# Patient Record
Sex: Male | Born: 1995 | Race: White | Hispanic: No | Marital: Single | Smoking: Former smoker
Health system: Southern US, Community
[De-identification: ages and names within clinical notes are randomized; demographics above are authoritative.]

## PROBLEM LIST (undated history)

## (undated) DIAGNOSIS — E039 Hypothyroidism, unspecified: Secondary | ICD-10-CM

## (undated) DIAGNOSIS — R011 Cardiac murmur, unspecified: Secondary | ICD-10-CM

## (undated) DIAGNOSIS — I10 Essential (primary) hypertension: Secondary | ICD-10-CM

## (undated) DIAGNOSIS — J189 Pneumonia, unspecified organism: Secondary | ICD-10-CM

## (undated) DIAGNOSIS — Z973 Presence of spectacles and contact lenses: Secondary | ICD-10-CM

## (undated) HISTORY — PX: FOOT SURGERY: SHX648

---
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
## 2020-02-10 ENCOUNTER — Encounter: Payer: Self-pay | Admitting: Family Medicine

## 2020-02-10 ENCOUNTER — Ambulatory Visit
Admission: EM | Admit: 2020-02-10 | Discharge: 2020-02-10 | Disposition: A | Discharge status: Home / Self Care | Payer: BC Managed Care – PPO | Attending: Family Medicine | Admitting: Family Medicine

## 2020-02-10 DIAGNOSIS — F411 Generalized anxiety disorder: Secondary | ICD-10-CM | POA: Diagnosis not present

## 2020-02-10 MED ORDER — HYDROXYZINE HCL 25 MG PO TABS: 25.0000 mg | ORAL_TABLET | Freq: Four times a day (QID) | ORAL | 0 refills | Status: DC

## 2020-02-10 NOTE — Discharge Instructions (Signed)
Use the hydroxyzine as needed for anxiety and to help rest I would follow up with your doctor to possible start on an SSRI Also would recommend see a therapist or counselor to talk Try to decrease stress Exercise, meditate

## 2020-02-10 NOTE — ED Triage Notes (Addendum)
Patients states that he is under a lot of stress at work and is having increased anxiety. States he has took on a new job recently and his stress has increased.

## 2020-02-11 NOTE — ED Provider Notes (Signed)
[facility]    CSN: [id number] Arrival date & time: 02/10/20  1126      History   Chief Complaint No chief complaint on file.   HPI Alex Rivera is a 24 y.o. male.   Patient is a 24 year old male presents today for stress and anxiety.  Reporting he has had increased anxiety over the past few months.  Reporting started since starting a new position at his work.  He has frequent anxiety attacks and anger outburst at work.  Is not currently taking any medication for anxiety.  Has had anxiety from time to time throughout his life.  Denies any severe depression or suicidal ideations.     History reviewed. No pertinent past medical history.  There are no problems to display for this patient.   History reviewed. No pertinent surgical history.     Home Medications    Prior to Admission medications   Medication Sig Start Date End Date Taking? Authorizing Provider  hydrOXYzine (ATARAX/VISTARIL) 25 MG tablet Take 1 tablet (25 mg total) by mouth every 6 (six) hours. 02/10/20   Janace Aris, NP    Family History Family History  Problem Relation Age of Onset  . Hypertension Mother   . Cancer Father     Social History Social History   Tobacco Use  . Smoking status: Current Some Day Smoker  . Smokeless tobacco: Never Used  Substance Use Topics  . Alcohol use: Not on file    Comment: occ  . Drug use: Never     Allergies   Patient has no known allergies.   Review of Systems Review of Systems   Physical Exam Triage Vital Signs ED Triage Vitals  Enc Vitals Group     BP 02/10/20 1138 (!) 147/103     Pulse Rate 02/10/20 1138 90     Resp 02/10/20 1138 16     Temp 02/10/20 1138 99 F (37.2 C)     Temp Source 02/10/20 1138 Oral     SpO2 02/10/20 1138 98 %     Weight 02/10/20 1145 (!) 315 lb (142.9 kg)     Height 02/10/20 1145 6' (1.829 m)     Head Circumference --      Peak Flow --      Pain Score 02/10/20 1142 0     Pain Loc --      Pain  Edu? --      Excl. in GC? --    No data found.  Updated Vital Signs BP (!) 147/103 (BP Location: Right Arm)   Pulse 90   Temp 99 F (37.2 C) (Oral)   Resp 16   Ht 6' (1.829 m)   Wt (!) 315 lb (142.9 kg)   SpO2 98%   BMI 42.72 kg/m   Visual Acuity Right Eye Distance:   Left Eye Distance:   Bilateral Distance:    Right Eye Near:   Left Eye Near:    Bilateral Near:     Physical Exam Vitals and nursing note reviewed.  Constitutional:      Appearance: Normal appearance.  HENT:     Head: Normocephalic and atraumatic.     Nose: Nose normal.  Eyes:     Conjunctiva/sclera: Conjunctivae normal.  Pulmonary:     Effort: Pulmonary effort is normal.  Musculoskeletal:        General: Normal range of motion.     Cervical back: Normal range of motion.  Skin:    General:  Skin is warm and dry.  Neurological:     Mental Status: He is alert.  Psychiatric:        Mood and Affect: Mood normal.      UC Treatments / Results  Labs (all labs ordered are listed, but only abnormal results are displayed) Labs Reviewed - No data to display  EKG   Radiology No results found.  Procedures Procedures (including critical care time)  Medications Ordered in UC Medications - No data to display  Initial Impression / Assessment and Plan / UC Course  I have reviewed the triage vital signs and the nursing notes.  Pertinent labs & imaging results that were available during my care of the patient were reviewed by me and considered in my medical decision making (see chart for details).     Anxiety No concerns for severe depression or suicidal ideations. Most likely anxiety related to stress of his job. Will prescribe hydroxyzine to use as needed and to help rest and sleep at night. Recommended follow-up with his doctor for further management and to be evaluated for possible initiation of SSRI. Recommended to see a therapist or counselor to talk Decrease stress, exercise and  meditate. Final Clinical Impressions(s) / UC Diagnoses   Final diagnoses:  Anxiety state     Discharge Instructions     Use the hydroxyzine as needed for anxiety and to help rest I would follow up with your doctor to possible start on an SSRI Also would recommend see a therapist or counselor to talk Try to decrease stress Exercise, meditate    ED Prescriptions    Medication Sig Dispense Auth. Provider   hydrOXYzine (ATARAX/VISTARIL) 25 MG tablet Take 1 tablet (25 mg total) by mouth every 6 (six) hours. 20 tablet Dahlia Byes A, NP     PDMP not reviewed this encounter.   Dahlia Byes A, NP 02/11/20 1455

## 2020-05-15 ENCOUNTER — Encounter (HOSPITAL_COMMUNITY): Payer: Self-pay | Admitting: Emergency Medicine

## 2020-05-15 ENCOUNTER — Emergency Department (HOSPITAL_COMMUNITY)
Admission: EM | Admit: 2020-05-15 | Discharge: 2020-05-15 | Disposition: A | Discharge status: Home / Self Care | Payer: BC Managed Care – PPO | Attending: Emergency Medicine | Admitting: Emergency Medicine

## 2020-05-15 ENCOUNTER — Other Ambulatory Visit: Payer: Self-pay

## 2020-05-15 ENCOUNTER — Emergency Department (HOSPITAL_COMMUNITY): Payer: BC Managed Care – PPO

## 2020-05-15 DIAGNOSIS — R1031 Right lower quadrant pain: Secondary | ICD-10-CM | POA: Diagnosis present

## 2020-05-15 DIAGNOSIS — F17210 Nicotine dependence, cigarettes, uncomplicated: Secondary | ICD-10-CM | POA: Insufficient documentation

## 2020-05-15 DIAGNOSIS — K4091 Unilateral inguinal hernia, without obstruction or gangrene, recurrent: Secondary | ICD-10-CM

## 2020-05-15 NOTE — ED Notes (Signed)
Patient transported to Ultrasound 

## 2020-05-15 NOTE — Discharge Instructions (Signed)
Please read the attachment on inguinal hernias.  I recommend that you take ibuprofen 600 mg every 6 hours as needed for pain symptoms.  Please wear compression shorts until you are evaluated by general surgery.  You will need to be seen by [facility] next week.  I spoke with Dr. Bedelia Person who asked that you call first thing Monday AM to schedule appointment.    Please try to avoid any strenuous activity or standing on your feet for extended periods of time.  I will provide you with a work note.  You may return to work, if only for light duty.  Please return to the ED or seek immediate medical attention should you experience any new or worsening symptoms.

## 2020-05-15 NOTE — ED Notes (Signed)
Patient verbalized understanding of dc instructions, vss, ambulatory with nad.   

## 2020-05-15 NOTE — ED Provider Notes (Signed)
[place] [facility] DEPART[facility] Provider Note   CSN: [id number] Arrival date & time: 05/15/20  4081     History Chief Complaint  Patient presents with  . Groin Pain  . Groin Swelling    Alex Rivera is a 24 y.o. male who presents the ED with complaints of progressively worsening swelling involving right scrotum.  Patient has a history of right-sided inguinal pain x1 year.  However, over the course of the past 4 to 5 days, he has been experiencing progressively worsening swelling involving the scrotum.  He states that his right testicle is approximately size of his fist.  He reports that he saw a pediatrician growing up and believes that he received all of his immunizations, including MMR.  He denies any fevers or chills.  He is sexually active, but uses protection.  Denies any penile discharge or rashes.  He also denies any abdominal pain aside from his subacute/chronic right-sided inguinal pain.  He works a physically demanding job at [employer] which requires him to lift heavy objects.  HPI     History reviewed. No pertinent past medical history.  There are no problems to display for this patient.   History reviewed. No pertinent surgical history.     Family History  Problem Relation Age of Onset  . Hypertension Mother   . Cancer Father     Social History   Tobacco Use  . Smoking status: Current Some Day Smoker  . Smokeless tobacco: Never Used  Substance Use Topics  . Alcohol use: Not on file    Comment: occ  . Drug use: Never    Home Medications Prior to Admission medications   Medication Sig Start Date End Date Taking? Authorizing Provider  hydrOXYzine (ATARAX/VISTARIL) 25 MG tablet Take 1 tablet (25 mg total) by mouth every 6 (six) hours. 02/10/20   Orvan July, NP    Allergies    Patient has no known allergies.  Review of Systems   Review of Systems  All other systems reviewed and are negative.   Physical Exam Updated  Vital Signs BP (!) 159/102 (BP Location: Left Arm)   Pulse 100   Temp 98 F (36.7 C) (Oral)   Resp 17   Ht 5' 11"  (1.803 m)   Wt (!) 142.9 kg   SpO2 100%   BMI 43.93 kg/m   Physical Exam Vitals and nursing note reviewed. Exam conducted with a chaperone present.  Constitutional:      Appearance: Normal appearance.  HENT:     Head: Normocephalic and atraumatic.  Eyes:     General: No scleral icterus.    Conjunctiva/sclera: Conjunctivae normal.  Cardiovascular:     Rate and Rhythm: Normal rate.     Pulses: Normal pulses.  Pulmonary:     Effort: Pulmonary effort is normal.  Abdominal:     General: Abdomen is flat. There is no distension.     Palpations: Abdomen is soft.     Comments: Right inguinal region mass/hernia noted. Mildly TTP.  Genitourinary:    Comments: Large, softball-sized mass right-side of scrotum. No fluctuance noted. Musculoskeletal:     Cervical back: Normal range of motion. No rigidity.  Skin:    General: Skin is dry.  Neurological:     Mental Status: He is alert.     GCS: GCS eye subscore is 4. GCS verbal subscore is 5. GCS motor subscore is 6.  Psychiatric:        Mood and Affect: Mood  normal.        Behavior: Behavior normal.        Thought Content: Thought content normal.     ED Results / Procedures / Treatments   Labs (all labs ordered are listed, but only abnormal results are displayed) Labs Reviewed - No data to display  EKG None  Radiology US Scrotum  Result Date: 05/15/2020 CLINICAL DATA:  Suspected inguinal hernia. EXAM: ULTRASOUND OF SCROTUM TECHNIQUE: Complete ultrasound examination of the testicles, epididymis, and other scrotal structures was performed. COMPARISON:  None. FINDINGS: Right testicle Measurements: 5.7 x 2.5 x 3.2 cm. No mass or microlithiasis visualized. The right testicle is displaced medially by a fat containing mass likely representing an inguinal hernia. Left testicle Measurements: 5.5 x 2.6 x 3.4 cm. No mass or  microlithiasis visualized. Right epididymis:  Not seen. Left epididymis:  Normal in size and appearance. Hydrocele:  None visualized. Varicocele:  Mild left varicocele. IMPRESSION: 1. The right testicle is displaced medially by a fat containing mass, likely representing an inguinal hernia. 2. Normal appearance of the left testicle. 3. Mild left varicocele. 4. Nonvisualization of the right epididymis. Electronically Signed   By: Fidela Salisbury M.D.   On: 05/15/2020 12:10    Procedures Procedures (including critical care time)  Medications Ordered in ED Medications - No data to display  ED Course  I have reviewed the triage vital signs and the nursing notes.  Pertinent labs & imaging results that were available during my care of the patient were reviewed by me and considered in my medical decision making (see chart for details).  Clinical Course as of May 15 1356  Sat May 15, 2020  1331 I spoke with Dr. Bobbye Morton with [facility].  She feels as though he is rather low risk given his chronicity of symptoms and lack of concern for incarceration.  No obvious bowel in the scrotum, appears to be primarily fat.   [GG]    Clinical Course User Index [GG] Corena Herter, PA-C   MDM Rules/Calculators/A&P                          Scrotal ultrasound is suggestive of fat-containing mass displacing right testicle, likely representative of an inguinal hernia.  This is consistent with his history and physical exam.  Will place patient in Trendelenburg and apply ice prior to attempted reduction.  Reduction attempted by Dr. Jeanell Sparrow at bedside. Partial resolution. He will need to wear tight compression shorts over the weekend and follow-up with [facility] as soon as possible.  Advised to avoid any strenuous activity or standing on his feet for extended periods of time.  I spoke with Dr. Bobbye Morton with [facility].  She feels as though he is rather low risk given his  chronicity of symptoms and lack of concern for incarceration.  No obvious bowel in the scrotum, appears to be primarily fat.  Discussed findings and plan with patient.  He voices understanding and is agreeable.   Final Clinical Impression(s) / ED Diagnoses Final diagnoses:  Unilateral recurrent inguinal hernia without obstruction or gangrene    Rx / DC Orders ED Discharge Orders    None       Corena Herter, PA-C 05/15/20 1357    Pattricia Boss, MD 05/17/20 [phone]

## 2020-05-15 NOTE — ED Triage Notes (Signed)
Pt. Stated, Alex Rivera had a hernia for about a year on the rt. groin area . Since Wednesday my rt. Testicle has been swollen anfd painful especially in morning and at work when [employer] lifting.

## 2020-05-18 ENCOUNTER — Emergency Department (HOSPITAL_COMMUNITY)
Admission: EM | Admit: 2020-05-18 | Discharge: 2020-05-18 | Disposition: A | Discharge status: Home / Self Care | Payer: BC Managed Care – PPO | Attending: Emergency Medicine | Admitting: Emergency Medicine

## 2020-05-18 ENCOUNTER — Encounter (HOSPITAL_COMMUNITY): Payer: Self-pay | Admitting: Pediatrics

## 2020-05-18 ENCOUNTER — Other Ambulatory Visit: Payer: Self-pay

## 2020-05-18 DIAGNOSIS — Z5321 Procedure and treatment not carried out due to patient leaving prior to being seen by health care provider: Secondary | ICD-10-CM | POA: Insufficient documentation

## 2020-05-18 DIAGNOSIS — N50819 Testicular pain, unspecified: Secondary | ICD-10-CM | POA: Diagnosis present

## 2020-05-18 LAB — CBC WITH DIFFERENTIAL/PLATELET
Abs Immature Granulocytes: 0.05 10*3/uL (ref 0.00–0.07)
Basophils Absolute: 0.1 10*3/uL (ref 0.0–0.1)
Basophils Relative: 1 %
Eosinophils Absolute: 0.5 10*3/uL (ref 0.0–0.5)
Eosinophils Relative: 4 %
HCT: 48.7 % (ref 39.0–52.0)
Hemoglobin: 16.4 g/dL (ref 13.0–17.0)
Immature Granulocytes: 0 %
Lymphocytes Relative: 23 %
Lymphs Abs: 3 10*3/uL (ref 0.7–4.0)
MCH: 29.8 pg (ref 26.0–34.0)
MCHC: 33.7 g/dL (ref 30.0–36.0)
MCV: 88.5 fL (ref 80.0–100.0)
Monocytes Absolute: 0.9 10*3/uL (ref 0.1–1.0)
Monocytes Relative: 7 %
Neutro Abs: 8.6 10*3/uL — ABNORMAL HIGH (ref 1.7–7.7)
Neutrophils Relative %: 65 %
Platelets: 294 10*3/uL (ref 150–400)
RBC: 5.5 MIL/uL (ref 4.22–5.81)
RDW: 12.8 % (ref 11.5–15.5)
WBC: 13 10*3/uL — ABNORMAL HIGH (ref 4.0–10.5)
nRBC: 0 % (ref 0.0–0.2)

## 2020-05-18 LAB — COMPREHENSIVE METABOLIC PANEL
ALT: 46 U/L — ABNORMAL HIGH (ref 0–44)
AST: 28 U/L (ref 15–41)
Albumin: 4.2 g/dL (ref 3.5–5.0)
Alkaline Phosphatase: 61 U/L (ref 38–126)
Anion gap: 11 (ref 5–15)
BUN: 8 mg/dL (ref 6–20)
CO2: 23 mmol/L (ref 22–32)
Calcium: 9.4 mg/dL (ref 8.9–10.3)
Chloride: 102 mmol/L (ref 98–111)
Creatinine, Ser: 0.84 mg/dL (ref 0.61–1.24)
GFR, Estimated: >60 mL/min (ref >60)
Glucose, Bld: 108 mg/dL — ABNORMAL HIGH (ref 70–99)
Potassium: 3.6 mmol/L (ref 3.5–5.1)
Sodium: 136 mmol/L (ref 135–145)
Total Bilirubin: 0.6 mg/dL (ref 0.3–1.2)
Total Protein: 7.3 g/dL (ref 6.5–8.1)

## 2020-05-18 NOTE — ED Triage Notes (Signed)
Reported was seen Saturday for groin pain and was diagnosed with Inguinal hernia. Stated pain is unresolved along w/ swelling and was instructed to return to ED for further eval. Stated unable to get an appt with OP surgery til later on.

## 2020-05-18 NOTE — ED Notes (Signed)
Pt called to be roomed, no response x1 

## 2020-05-18 NOTE — ED Notes (Signed)
Pt called no response, x2

## 2020-05-18 NOTE — ED Notes (Signed)
Pt called to be roomed, no response 

## 2020-05-19 ENCOUNTER — Observation Stay (HOSPITAL_COMMUNITY)
Admission: EM | Admit: 2020-05-19 | Discharge: 2020-05-20 | Disposition: A | Discharge status: Home / Self Care | Payer: BC Managed Care – PPO | Attending: Physician Assistant | Admitting: Physician Assistant

## 2020-05-19 ENCOUNTER — Encounter (HOSPITAL_COMMUNITY): Payer: Self-pay | Admitting: Emergency Medicine

## 2020-05-19 ENCOUNTER — Other Ambulatory Visit: Payer: Self-pay

## 2020-05-19 DIAGNOSIS — K4031 Unilateral inguinal hernia, with obstruction, without gangrene, recurrent: Principal | ICD-10-CM | POA: Insufficient documentation

## 2020-05-19 DIAGNOSIS — Z720 Tobacco use: Secondary | ICD-10-CM | POA: Insufficient documentation

## 2020-05-19 DIAGNOSIS — K4091 Unilateral inguinal hernia, without obstruction or gangrene, recurrent: Secondary | ICD-10-CM | POA: Diagnosis present

## 2020-05-19 DIAGNOSIS — Z20822 Contact with and (suspected) exposure to covid-19: Secondary | ICD-10-CM | POA: Insufficient documentation

## 2020-05-19 DIAGNOSIS — K4090 Unilateral inguinal hernia, without obstruction or gangrene, not specified as recurrent: Secondary | ICD-10-CM

## 2020-05-19 LAB — COMPREHENSIVE METABOLIC PANEL
ALT: 54 U/L — ABNORMAL HIGH (ref 0–44)
AST: 54 U/L — ABNORMAL HIGH (ref 15–41)
Albumin: 4 g/dL (ref 3.5–5.0)
Alkaline Phosphatase: 57 U/L (ref 38–126)
Anion gap: 11 (ref 5–15)
BUN: 8 mg/dL (ref 6–20)
CO2: 23 mmol/L (ref 22–32)
Calcium: 9.2 mg/dL (ref 8.9–10.3)
Chloride: 102 mmol/L (ref 98–111)
Creatinine, Ser: 0.79 mg/dL (ref 0.61–1.24)
GFR, Estimated: >60 mL/min (ref >60)
Glucose, Bld: 76 mg/dL (ref 70–99)
Potassium: 5.5 mmol/L — ABNORMAL HIGH (ref 3.5–5.1)
Sodium: 136 mmol/L (ref 135–145)
Total Bilirubin: 1.8 mg/dL — ABNORMAL HIGH (ref 0.3–1.2)
Total Protein: 7 g/dL (ref 6.5–8.1)

## 2020-05-19 LAB — CBC WITH DIFFERENTIAL/PLATELET
Abs Immature Granulocytes: 0.05 10*3/uL (ref 0.00–0.07)
Basophils Absolute: 0.1 10*3/uL (ref 0.0–0.1)
Basophils Relative: 0 %
Eosinophils Absolute: 0.2 10*3/uL (ref 0.0–0.5)
Eosinophils Relative: 1 %
HCT: 49 % (ref 39.0–52.0)
Hemoglobin: 16.8 g/dL (ref 13.0–17.0)
Immature Granulocytes: 0 %
Lymphocytes Relative: 16 %
Lymphs Abs: 2.1 10*3/uL (ref 0.7–4.0)
MCH: 30.3 pg (ref 26.0–34.0)
MCHC: 34.3 g/dL (ref 30.0–36.0)
MCV: 88.3 fL (ref 80.0–100.0)
Monocytes Absolute: 0.7 10*3/uL (ref 0.1–1.0)
Monocytes Relative: 5 %
Neutro Abs: 10.1 10*3/uL — ABNORMAL HIGH (ref 1.7–7.7)
Neutrophils Relative %: 78 %
Platelets: 320 10*3/uL (ref 150–400)
RBC: 5.55 MIL/uL (ref 4.22–5.81)
RDW: 13.1 % (ref 11.5–15.5)
WBC: 13.3 10*3/uL — ABNORMAL HIGH (ref 4.0–10.5)
nRBC: 0 % (ref 0.0–0.2)

## 2020-05-19 LAB — RESPIRATORY PANEL BY RT PCR (FLU A&B, COVID)
Influenza A by PCR: NEGATIVE
Influenza B by PCR: NEGATIVE
SARS Coronavirus 2 by RT PCR: NEGATIVE

## 2020-05-19 LAB — HIV ANTIBODY (ROUTINE TESTING W REFLEX): HIV Screen 4th Generation wRfx: NONREACTIVE

## 2020-05-19 MED ORDER — NICOTINE 7 MG/24HR TD PT24
7.0000 mg | MEDICATED_PATCH | Freq: Every day | TRANSDERMAL | Status: DC
  Filled 2020-05-19: qty 1

## 2020-05-19 MED ORDER — ACETAMINOPHEN 650 MG RE SUPP: 650.0000 mg | Freq: Four times a day (QID) | RECTAL | Status: DC | PRN

## 2020-05-19 MED ORDER — ACETAMINOPHEN 500 MG PO TABS: 1000.0000 mg | ORAL_TABLET | ORAL | Status: DC

## 2020-05-19 MED ORDER — ONDANSETRON HCL 4 MG/2ML IJ SOLN: 4.0000 mg | Freq: Four times a day (QID) | INTRAMUSCULAR | Status: DC | PRN

## 2020-05-19 MED ORDER — CEFAZOLIN SODIUM-DEXTROSE 2-4 GM/100ML-% IV SOLN
2.0000 g | INTRAVENOUS | Status: AC
  Administered 2020-05-20: 2 g via INTRAVENOUS
  Filled 2020-05-19 (×2): qty 100

## 2020-05-19 MED ORDER — GABAPENTIN 300 MG PO CAPS: 300.0000 mg | ORAL_CAPSULE | ORAL | Status: DC

## 2020-05-19 MED ORDER — METHOCARBAMOL 500 MG PO TABS: 500.0000 mg | ORAL_TABLET | Freq: Four times a day (QID) | ORAL | Status: DC | PRN

## 2020-05-19 MED ORDER — AMLODIPINE BESYLATE 5 MG PO TABS
5.0000 mg | ORAL_TABLET | Freq: Every day | ORAL | Status: DC
  Administered 2020-05-20: 5 mg via ORAL
  Filled 2020-05-19: qty 1

## 2020-05-19 MED ORDER — SIMETHICONE 80 MG PO CHEW
40.0000 mg | CHEWABLE_TABLET | Freq: Four times a day (QID) | ORAL | Status: DC | PRN
  Filled 2020-05-19: qty 1

## 2020-05-19 MED ORDER — FLUOXETINE HCL 20 MG PO CAPS
20.0000 mg | ORAL_CAPSULE | Freq: Every day | ORAL | Status: DC
  Administered 2020-05-20: 20 mg via ORAL
  Filled 2020-05-19: qty 1

## 2020-05-19 MED ORDER — MORPHINE SULFATE (PF) 2 MG/ML IV SOLN
2.0000 mg | Freq: Once | INTRAVENOUS | Status: AC
  Administered 2020-05-19: 2 mg via INTRAVENOUS
  Filled 2020-05-19: qty 1

## 2020-05-19 MED ORDER — METOPROLOL TARTRATE 5 MG/5ML IV SOLN: 5.0000 mg | Freq: Four times a day (QID) | INTRAVENOUS | Status: DC | PRN

## 2020-05-19 MED ORDER — POLYETHYLENE GLYCOL 3350 17 G PO PACK: 17.0000 g | PACK | Freq: Every day | ORAL | Status: DC | PRN

## 2020-05-19 MED ORDER — SODIUM CHLORIDE 0.9 % IV SOLN: INTRAVENOUS | Status: DC

## 2020-05-19 MED ORDER — DIPHENHYDRAMINE HCL 50 MG/ML IJ SOLN: 25.0000 mg | Freq: Four times a day (QID) | INTRAMUSCULAR | Status: DC | PRN

## 2020-05-19 MED ORDER — ACETAMINOPHEN 325 MG PO TABS: 650.0000 mg | ORAL_TABLET | Freq: Four times a day (QID) | ORAL | Status: DC | PRN

## 2020-05-19 MED ORDER — MORPHINE SULFATE (PF) 2 MG/ML IV SOLN: 2.0000 mg | INTRAVENOUS | Status: DC | PRN

## 2020-05-19 MED ORDER — ONDANSETRON 4 MG PO TBDP: 4.0000 mg | ORAL_TABLET | Freq: Four times a day (QID) | ORAL | Status: DC | PRN

## 2020-05-19 MED ORDER — DIPHENHYDRAMINE HCL 25 MG PO CAPS: 25.0000 mg | ORAL_CAPSULE | Freq: Four times a day (QID) | ORAL | Status: DC | PRN

## 2020-05-19 MED ORDER — OXYCODONE HCL 5 MG PO TABS
5.0000 mg | ORAL_TABLET | ORAL | Status: DC | PRN
  Administered 2020-05-20: 5 mg via ORAL
  Filled 2020-05-19: qty 1

## 2020-05-19 MED ORDER — ENOXAPARIN SODIUM 40 MG/0.4ML ~~LOC~~ SOLN
40.0000 mg | SUBCUTANEOUS | Status: DC
  Administered 2020-05-19 – 2020-05-20 (×2): 40 mg via SUBCUTANEOUS
  Filled 2020-05-19 (×2): qty 0.4

## 2020-05-19 NOTE — H&P (Signed)
[facility] Admission Note  Alex Rivera 23-Oct-1995  [id number].    Requesting MD: Mancel Bale Chief Complaint/Reason for Consult: right inguinal hernia  HPI:  Alex Rivera is a 24yo male PMH HTN, anxiety, and obesity who presented to [facility] with worsening right groin pain.  States that he has had a known right inguinal hernia for about 1 year. Over the past weekend he had acute worsening pain prompting him to come to the ED. He was seen by EDP, u/s showed right testicle is displaced medially by a fat containing mass likely representing an inguinal hernia, hernia was partially reducible and there was felt to be no indication of strangulation or SBO so he was discharged home and advised to call [facility] for appointment. States that he could not get an appointment until 06/24/20. He has continued to have severe pain and swelling in the right groin so he returned to the ED last night. Some nausea, no emesis. Denies fever, chills, constipation, or issues with urination. Last BM this AM and he is passing flatus.  General surgery asked to evaluate.  Abdominal surgical history: none Anticoagulants: none Quit smoking cigarettes, now vapes Drinks alcohol occasionally Admits to rare THC use, otherwise no illicit drugs Employment: Production designer, theatre/television/film at [employer]  Review of Systems  Constitutional: Negative.   HENT: Negative.   Eyes: Negative.   Respiratory: Negative.   Cardiovascular: Negative.   Gastrointestinal: Negative for abdominal pain, constipation, nausea and vomiting.       Right groin pain  Genitourinary: Negative.   Musculoskeletal: Negative.   Skin: Negative.   Neurological: Negative.    All systems reviewed and otherwise negative except for as above  Family History  Problem Relation Age of Onset  . Hypertension Mother   . Cancer Father     History reviewed. No pertinent past medical history.  History reviewed. No pertinent surgical history.  Social History:   reports that he has been smoking. He has never used smokeless tobacco. He reports that he does not use drugs. No history on file for alcohol use.  Allergies: No Known Allergies  (Not in a hospital admission)   Prior to Admission medications   Medication Sig Start Date End Date Taking? Authorizing Provider  Multiple Vitamin (MULTIVITAMIN) tablet Take 1 tablet by mouth daily.   Yes [provider]  amLODipine (NORVASC) 5 MG tablet Take 5 mg by mouth daily. 04/30/20   [provider]  FLUoxetine (PROZAC) 20 MG capsule Take 20 mg by mouth daily. 04/30/20   [provider]    Blood pressure 138/84, pulse 80, temperature 98.1 F (36.7 C), temperature source Oral, resp. rate 18, SpO2 99 %. Physical Exam: General: pleasant, WD/WN white male who is laying in bed in NAD HEENT: head is normocephalic, atraumatic.  Sclera are noninjected.  PERRL.  Ears and nose without any masses or lesions.  Mouth is pink and moist. Dentition fair Heart: regular, rate, and rhythm.  Normal s1,s2. No obvious murmurs, gallops, or rubs noted.  Palpable pedal pulses bilaterally  Lungs: CTAB, no wheezes, rhonchi, or rales noted.  Respiratory effort nonlabored Abd: obese, soft, NT/ND, +BS, no masses or organomegaly. Large right inguinal hernia partially reducible and mildly TTP, may contain bowel MS: no BUE/BLE edema, calves soft and nontender Skin: warm and dry with no masses, lesions, or rashes Psych: A&Ox4 with an appropriate affect Neuro: cranial nerves grossly intact, equal strength in BUE/BLE bilaterally, normal speech, thought process intact  Results for orders placed  or performed during the hospital encounter of 05/19/20 (from the past 48 hour(s))  CBC with Differential     Status: Abnormal   Collection Time: 05/19/20 12:24 PM  Result Value Ref Range   WBC 13.3 (H) 4.0 - 10.5 K/uL   RBC 5.55 4.22 - 5.81 MIL/uL   Hemoglobin 16.8 13.0 - 17.0 g/dL   HCT 27.2 39 - 52 %   MCV 88.3 80.0 -  100.0 fL   MCH 30.3 26.0 - 34.0 pg   MCHC 34.3 30.0 - 36.0 g/dL   RDW 53.6 64.4 - 03.4 %   Platelets 320 150 - 400 K/uL   nRBC 0.0 0.0 - 0.2 %   Neutrophils Relative % 78 %   Neutro Abs 10.1 (H) 1.7 - 7.7 K/uL   Lymphocytes Relative 16 %   Lymphs Abs 2.1 0.7 - 4.0 K/uL   Monocytes Relative 5 %   Monocytes Absolute 0.7 0.1 - 1.0 K/uL   Eosinophils Relative 1 %   Eosinophils Absolute 0.2 0.0 - 0.5 K/uL   Basophils Relative 0 %   Basophils Absolute 0.1 0.0 - 0.1 K/uL   Immature Granulocytes 0 %   Abs Immature Granulocytes 0.05 0.00 - 0.07 K/uL    Comment: Performed at [facility], [address], [place], [place] [address]  Respiratory Panel by RT PCR (Flu A&B, Covid) - Nasopharyngeal Swab     Status: None   Collection Time: 05/19/20  1:00 PM   Specimen: Nasopharyngeal Swab  Result Value Ref Range   SARS Coronavirus 2 by RT PCR NEGATIVE NEGATIVE    Comment: (NOTE) SARS-CoV-2 target nucleic acids are NOT DETECTED.  The SARS-CoV-2 RNA is generally detectable in upper respiratoy specimens during the acute phase of infection. The lowest concentration of SARS-CoV-2 viral copies this assay can detect is 131 copies/mL. A negative result does not preclude SARS-Cov-2 infection and should not be used as the sole basis for treatment or other patient management decisions. A negative result may occur with  improper specimen collection/handling, submission of specimen other than nasopharyngeal swab, presence of viral mutation(s) within the areas targeted by this assay, and inadequate number of viral copies (<131 copies/mL). A negative result must be combined with clinical observations, patient history, and epidemiological information. The expected result is Negative.  Fact Sheet for Patients:  https://www.moore.com/  Fact Sheet for Healthcare Providers:  https://www.young.biz/  This test is no t yet approved or cleared by the [place] FDA  and  has been authorized for detection and/or diagnosis of SARS-CoV-2 by FDA under an Emergency Use Authorization (EUA). This EUA will remain  in effect (meaning this test can be used) for the duration of the COVID-19 declaration under Section 564(b)(1) of the Act, 21 U.S.C. section 360bbb-3(b)(1), unless the authorization is terminated or revoked sooner.     Influenza A by PCR NEGATIVE NEGATIVE   Influenza B by PCR NEGATIVE NEGATIVE    Comment: (NOTE) The Xpert Xpress SARS-CoV-2/FLU/RSV assay is intended as an aid in  the diagnosis of influenza from Nasopharyngeal swab specimens and  should not be used as a sole basis for treatment. Nasal washings and  aspirates are unacceptable for Xpert Xpress SARS-CoV-2/FLU/RSV  testing.  Fact Sheet for Patients: https://www.moore.com/  Fact Sheet for Healthcare Providers: https://www.young.biz/  This test is not yet approved or cleared by the [place] FDA and  has been authorized for detection and/or diagnosis of SARS-CoV-2 by  FDA under an Emergency Use Authorization (EUA). This EUA  will remain  in effect (meaning this test can be used) for the duration of the  Covid-19 declaration under Section 564(b)(1) of the Act, 21  U.S.C. section 360bbb-3(b)(1), unless the authorization is  terminated or revoked. Performed at [facility], [address] [place], [place] [address]    No results found.    Assessment/Plan HTN Anxiety Obesity BMI 43.93 Nicotine dependence   Large right inguinal hernia - Due to size of the hernia and persistent worsening symptoms, will plan for repair this admission pending covid test. Admit to observation. Ok for diet today, NPO after midnight. Plan for open right inguinal hernia repair tomorrow.  ID - none VTE - SCDs, lovenox FEN - reg diet, NPO after midnight Foley - none Follow up - TBD  Franne Forts, [facility] 05/19/2020, 2:21  PM Please see Amion for pager number during day hours 7:00am-4:30pm

## 2020-05-19 NOTE — ED Triage Notes (Signed)
Pt arrives to ED with complaints of increased pain/swelling to his recently dx right inguinal hernia. Has surgery scheduled for Dec 9th but is unable to take the pain and wait. No problems urinating.

## 2020-05-19 NOTE — ED Provider Notes (Signed)
[facility] EMERGENCY DEPARTMENT Provider Note   CSN: [id number] Arrival date & time: 05/19/20  1007     History Chief Complaint  Patient presents with  . Inguinal Hernia    Alex Rivera is a 24 y.o. male.  HPI    Patient with no significant medical history presents to the emergency department with chief complaint of right inguinal hernia.  Patient states his inguinal hernia has increasingly gotten larger and he is experiencing more more pain.  He endorses occasional nausea but is tolerating p.o., denies constipation states he is passing flatus, denies any urinary symptoms.  He was recently at the emergency department on 10/30 with same complaints he had ultrasound performed showing fat-containing mass likely A inguinal hernia, no acute abnormalities noted.  Central canal was consulted felt patient could be follow-up outpatient.  He is back here today with worsening pain.  He has no abdominal surgeries, last time he ate or drink was around 9 AM this morning.  He had some coffee.  Patient denies headache, fever, chills, shortness of breath, chest pain, abdominal pain, vomiting, diarrhea, constipation, urinary symptoms, pedal edema.  History reviewed. No pertinent past medical history.  Patient Active Problem List   Diagnosis Date Noted  . Right inguinal hernia 05/19/2020    History reviewed. No pertinent surgical history.     Family History  Problem Relation Age of Onset  . Hypertension Mother   . Cancer Father     Social History   Tobacco Use  . Smoking status: Current Some Day Smoker  . Smokeless tobacco: Never Used  Substance Use Topics  . Alcohol use: Not on file    Comment: occ  . Drug use: Never    Home Medications Prior to Admission medications   Medication Sig Start Date End Date Taking? Authorizing Provider  Multiple Vitamin (MULTIVITAMIN) tablet Take 1 tablet by mouth daily.   Yes [provider]  amLODipine (NORVASC) 5 MG  tablet Take 5 mg by mouth daily. 04/30/20   [provider]  FLUoxetine (PROZAC) 20 MG capsule Take 20 mg by mouth daily. 04/30/20   [provider]    Allergies    Patient has no known allergies.  Review of Systems   Review of Systems  Constitutional: Negative for chills and fever.  HENT: Negative for congestion, tinnitus, trouble swallowing and voice change.   Respiratory: Negative for shortness of breath.   Cardiovascular: Negative for chest pain.  Gastrointestinal: Positive for nausea. Negative for abdominal pain, constipation, diarrhea and vomiting.  Genitourinary: Positive for scrotal swelling and testicular pain. Negative for enuresis, flank pain, frequency, genital sores and penile swelling.  Musculoskeletal: Negative for back pain.  Skin: Negative for rash.  Neurological: Negative for dizziness and headaches.  Hematological: Does not bruise/bleed easily.    Physical Exam Updated Vital Signs BP 132/77   Pulse 78   Temp 98.1 F (36.7 C) (Oral)   Resp 18   SpO2 96%   Physical Exam Vitals and nursing note reviewed. Exam conducted with a chaperone present.  Constitutional:      General: He is not in acute distress.    Appearance: He is not ill-appearing.  HENT:     Head: Normocephalic and atraumatic.     Nose: No congestion.     Mouth/Throat:     Mouth: Mucous membranes are moist.     Pharynx: Oropharynx is clear.  Eyes:     General: No scleral icterus. Cardiovascular:  Rate and Rhythm: Normal rate and regular rhythm.     Pulses: Normal pulses.     Heart sounds: No murmur heard.  No friction rub. No gallop.   Pulmonary:     Effort: No respiratory distress.     Breath sounds: No wheezing, rhonchi or rales.  Abdominal:     General: There is no distension.     Palpations: Abdomen is soft.     Tenderness: There is no abdominal tenderness. There is no right CVA tenderness, left CVA tenderness or guarding.  Genitourinary:    Penis: Normal.        Comments: With chaperone present General exam was performed had enlarged right scrotum, erythematous, warm to the touch, no other gross abnormalities noted.  Scrotum was firm to the touch tender with palpation.   Musculoskeletal:        General: No swelling.  Skin:    General: Skin is warm and dry.     Findings: No rash.  Neurological:     Mental Status: He is alert.  Psychiatric:        Mood and Affect: Mood normal.     ED Results / Procedures / Treatments   Labs (all labs ordered are listed, but only abnormal results are displayed) Labs Reviewed  CBC WITH DIFFERENTIAL/PLATELET - Abnormal; Notable for the following components:      Result Value   WBC 13.3 (*)    Neutro Abs 10.1 (*)    All other components within normal limits  RESPIRATORY PANEL BY RT PCR (FLU A&B, COVID)  COMPREHENSIVE METABOLIC PANEL  HIV ANTIBODY (ROUTINE TESTING W REFLEX)    EKG None  Radiology No results found.  Procedures Hernia reduction  Date/Time: 05/19/2020 1:05 PM Performed by: Carroll Sage, PA-C Authorized by: Carroll Sage, PA-C  Local anesthesia used: no  Anesthesia: Local anesthesia used: no  Sedation: Patient sedated: no  Patient tolerance: patient tolerated the procedure well with no immediate complications Comments: Patient Trendelenburg and hips flexed, unsuccessful in fully reducing inguinal hernia.  Patient still in discomfort.    (including critical care time)  Medications Ordered in ED Medications  FLUoxetine (PROZAC) capsule 20 mg (has no administration in time range)  amLODipine (NORVASC) tablet 5 mg (has no administration in time range)  gabapentin (NEURONTIN) capsule 300 mg (has no administration in time range)  acetaminophen (TYLENOL) tablet 1,000 mg (has no administration in time range)  ceFAZolin (ANCEF) IVPB 2g/100 mL premix (has no administration in time range)  enoxaparin (LOVENOX) injection 40 mg (has no administration in time range)  0.9  %  sodium chloride infusion (has no administration in time range)  metoprolol tartrate (LOPRESSOR) injection 5 mg (has no administration in time range)  nicotine (NICODERM CQ - dosed in mg/24 hr) patch 7 mg (has no administration in time range)  simethicone (MYLICON) chewable tablet 40 mg (has no administration in time range)  ondansetron (ZOFRAN-ODT) disintegrating tablet 4 mg (has no administration in time range)    Or  ondansetron (ZOFRAN) injection 4 mg (has no administration in time range)  polyethylene glycol (MIRALAX / GLYCOLAX) packet 17 g (has no administration in time range)  diphenhydrAMINE (BENADRYL) capsule 25 mg (has no administration in time range)    Or  diphenhydrAMINE (BENADRYL) injection 25 mg (has no administration in time range)  methocarbamol (ROBAXIN) tablet 500 mg (has no administration in time range)  morphine 2 MG/ML injection 2-4 mg (has no administration in time range)  oxyCODONE (Oxy IR/ROXICODONE)  immediate release tablet 5-10 mg (has no administration in time range)  acetaminophen (TYLENOL) tablet 650 mg (has no administration in time range)    Or  acetaminophen (TYLENOL) suppository 650 mg (has no administration in time range)  morphine 2 MG/ML injection 2 mg (2 mg Intravenous Given 05/19/20 1336)    ED Course  I have reviewed the triage vital signs and the nursing notes.  Pertinent labs & imaging results that were available during my care of the patient were reviewed by me and considered in my medical decision making (see chart for details).    MDM Rules/Calculators/A&P                          Patient presents with right inguinal hernia.  He is alert, did not appear acute distress, vital signs reassuring.  Will order basic labs and Covid reevaluation.  Attempted to reduce hernia unsuccessful for reducing, patient is still in pain.  Due to recurrent pain and successful reduction will consult [facility] for further evaluation management.   Spoke with Jasmine December PA with surgical team and they will come and assess the patient.  Patient was evaluated by [facility] with surgical team and they recommend admission for inguinal hernia repair.  He would be admitted MedSurg.  Low suspicion for pyelonephritis or UTI as patient denies urinary symptoms, lower back pain, nausea, vomiting, fevers or chills.  Low suspicion for epididymitis or prostatitis as patient denies saddle pain, patient had scrotal ultrasound which did not reveal any infectious for scrotal swelling.  Low suspicion for herpes as there is no noted rash on patient's genitalia.  Low suspicion for STD as patient denies penile discharge, testicular pain, or rash around the genitalia. Low suspicion for perforated bowel or bowel obstruction as denies constipation, passing flatus, no peritoneal sign noted on exam.  I suspect patient has nonreducible inguinal hernia causing his symptoms.    Patient was reassessed states he is feeling better, vital signs reassuring, patient will be transferred to [facility] team for further evaluation.   Final Clinical Impression(s) / ED Diagnoses Final diagnoses:  Right inguinal hernia    Rx / DC Orders ED Discharge Orders    None       Carroll Sage, PA-C 05/19/20 1507    Mancel Bale, MD 05/19/20 1702

## 2020-05-20 ENCOUNTER — Observation Stay (HOSPITAL_COMMUNITY): Payer: BC Managed Care – PPO | Admitting: Anesthesiology

## 2020-05-20 ENCOUNTER — Encounter (HOSPITAL_COMMUNITY)
Admission: EM | Disposition: A | Discharge status: Home / Self Care | Payer: Self-pay | Source: Home / Self Care | Attending: Emergency Medicine

## 2020-05-20 HISTORY — PX: INGUINAL HERNIA REPAIR: SHX194

## 2020-05-20 SURGERY — REPAIR, HERNIA, INGUINAL, ADULT
Anesthesia: General | Site: Groin | Laterality: Right

## 2020-05-20 MED ORDER — DEXAMETHASONE SODIUM PHOSPHATE 10 MG/ML IJ SOLN
INTRAMUSCULAR | Status: DC | PRN
  Administered 2020-05-20: 10 mg via INTRAVENOUS

## 2020-05-20 MED ORDER — LACTATED RINGERS IV SOLN: INTRAVENOUS | Status: DC | PRN

## 2020-05-20 MED ORDER — BUPIVACAINE HCL (PF) 0.25 % IJ SOLN
INTRAMUSCULAR | Status: AC
  Filled 2020-05-20: qty 30

## 2020-05-20 MED ORDER — ROCURONIUM BROMIDE 10 MG/ML (PF) SYRINGE
PREFILLED_SYRINGE | INTRAVENOUS | Status: DC | PRN
  Administered 2020-05-20: 10 mg via INTRAVENOUS
  Administered 2020-05-20 (×2): 20 mg via INTRAVENOUS
  Administered 2020-05-20: 50 mg via INTRAVENOUS
  Administered 2020-05-20: 10 mg via INTRAVENOUS

## 2020-05-20 MED ORDER — MIDAZOLAM HCL 5 MG/5ML IJ SOLN
INTRAMUSCULAR | Status: DC | PRN
  Administered 2020-05-20: 2 mg via INTRAVENOUS

## 2020-05-20 MED ORDER — PROPOFOL 10 MG/ML IV BOLUS
INTRAVENOUS | Status: DC | PRN
  Administered 2020-05-20: 200 mg via INTRAVENOUS

## 2020-05-20 MED ORDER — LIDOCAINE HCL (CARDIAC) PF 100 MG/5ML IV SOSY
PREFILLED_SYRINGE | INTRAVENOUS | Status: DC | PRN
  Administered 2020-05-20: 30 mg via INTRAVENOUS

## 2020-05-20 MED ORDER — SUCCINYLCHOLINE CHLORIDE 20 MG/ML IJ SOLN
INTRAMUSCULAR | Status: DC | PRN
  Administered 2020-05-20: 160 mg via INTRAVENOUS

## 2020-05-20 MED ORDER — POLYETHYLENE GLYCOL 3350 17 G PO PACK: 17.0000 g | PACK | Freq: Every day | ORAL | 0 refills | Status: AC | PRN

## 2020-05-20 MED ORDER — MIDAZOLAM HCL 2 MG/2ML IJ SOLN: 2.0000 mg | Freq: Once | INTRAMUSCULAR | Status: AC

## 2020-05-20 MED ORDER — HYDROMORPHONE HCL 1 MG/ML IJ SOLN
0.2500 mg | INTRAMUSCULAR | Status: DC | PRN
  Administered 2020-05-20 (×2): 0.5 mg via INTRAVENOUS

## 2020-05-20 MED ORDER — 0.9 % SODIUM CHLORIDE (POUR BTL) OPTIME
TOPICAL | Status: DC | PRN
  Administered 2020-05-20: 1000 mL

## 2020-05-20 MED ORDER — MEPERIDINE HCL 25 MG/ML IJ SOLN: 6.2500 mg | INTRAMUSCULAR | Status: DC | PRN

## 2020-05-20 MED ORDER — ORAL CARE MOUTH RINSE: 15.0000 mL | Freq: Once | OROMUCOSAL | Status: AC

## 2020-05-20 MED ORDER — MIDAZOLAM HCL 2 MG/2ML IJ SOLN
INTRAMUSCULAR | Status: AC
  Administered 2020-05-20: 2 mg via INTRAVENOUS
  Filled 2020-05-20: qty 2

## 2020-05-20 MED ORDER — SUGAMMADEX SODIUM 200 MG/2ML IV SOLN
INTRAVENOUS | Status: DC | PRN
  Administered 2020-05-20: 400 mg via INTRAVENOUS

## 2020-05-20 MED ORDER — DOCUSATE SODIUM 100 MG PO CAPS
100.0000 mg | ORAL_CAPSULE | Freq: Two times a day (BID) | ORAL | Status: DC
  Administered 2020-05-20: 100 mg via ORAL
  Filled 2020-05-20: qty 1

## 2020-05-20 MED ORDER — CHLORHEXIDINE GLUCONATE 0.12 % MT SOLN
15.0000 mL | Freq: Once | OROMUCOSAL | Status: AC
  Administered 2020-05-20: 15 mL via OROMUCOSAL

## 2020-05-20 MED ORDER — FENTANYL CITRATE (PF) 250 MCG/5ML IJ SOLN
INTRAMUSCULAR | Status: AC
  Filled 2020-05-20: qty 5

## 2020-05-20 MED ORDER — ONDANSETRON HCL 4 MG/2ML IJ SOLN
INTRAMUSCULAR | Status: DC | PRN
  Administered 2020-05-20: 4 mg via INTRAVENOUS

## 2020-05-20 MED ORDER — FENTANYL CITRATE (PF) 100 MCG/2ML IJ SOLN
INTRAMUSCULAR | Status: DC | PRN
  Administered 2020-05-20: 100 ug via INTRAVENOUS
  Administered 2020-05-20: 150 ug via INTRAVENOUS

## 2020-05-20 MED ORDER — ACETAMINOPHEN 500 MG PO TABS: 1000.0000 mg | ORAL_TABLET | Freq: Once | ORAL | Status: AC

## 2020-05-20 MED ORDER — MORPHINE SULFATE (PF) 2 MG/ML IV SOLN: 2.0000 mg | INTRAVENOUS | Status: DC | PRN

## 2020-05-20 MED ORDER — ACETAMINOPHEN 500 MG PO TABS
1000.0000 mg | ORAL_TABLET | Freq: Three times a day (TID) | ORAL | Status: DC
  Administered 2020-05-20: 1000 mg via ORAL
  Filled 2020-05-20: qty 2

## 2020-05-20 MED ORDER — ACETAMINOPHEN 500 MG PO TABS: 1000.0000 mg | ORAL_TABLET | Freq: Three times a day (TID) | ORAL | 0 refills | Status: DC | PRN

## 2020-05-20 MED ORDER — OXYCODONE HCL 5 MG/5ML PO SOLN: 5.0000 mg | Freq: Once | ORAL | Status: DC | PRN

## 2020-05-20 MED ORDER — MIDAZOLAM HCL 2 MG/2ML IJ SOLN
INTRAMUSCULAR | Status: AC
  Filled 2020-05-20: qty 2

## 2020-05-20 MED ORDER — PROMETHAZINE HCL 25 MG/ML IJ SOLN: 6.2500 mg | INTRAMUSCULAR | Status: DC | PRN

## 2020-05-20 MED ORDER — LACTATED RINGERS IV SOLN: INTRAVENOUS | Status: DC

## 2020-05-20 MED ORDER — BUPIVACAINE-EPINEPHRINE 0.25% -1:200000 IJ SOLN
INTRAMUSCULAR | Status: DC | PRN
  Administered 2020-05-20: 20 mL

## 2020-05-20 MED ORDER — OXYCODONE HCL 5 MG PO TABS: 5.0000 mg | ORAL_TABLET | Freq: Once | ORAL | Status: DC | PRN

## 2020-05-20 MED ORDER — HYDROMORPHONE HCL 1 MG/ML IJ SOLN
INTRAMUSCULAR | Status: AC
  Filled 2020-05-20: qty 1

## 2020-05-20 MED ORDER — METHOCARBAMOL 500 MG PO TABS: 500.0000 mg | ORAL_TABLET | Freq: Four times a day (QID) | ORAL | 0 refills | Status: DC | PRN

## 2020-05-20 MED ORDER — ACETAMINOPHEN 500 MG PO TABS
ORAL_TABLET | ORAL | Status: AC
  Administered 2020-05-20: 1000 mg via ORAL
  Filled 2020-05-20: qty 2

## 2020-05-20 MED ORDER — OXYCODONE HCL 5 MG PO TABS
5.0000 mg | ORAL_TABLET | Freq: Four times a day (QID) | ORAL | 0 refills | Status: DC | PRN
Start: 2020-05-20 — End: 2021-08-31

## 2020-05-20 MED ORDER — FENTANYL CITRATE (PF) 100 MCG/2ML IJ SOLN
INTRAMUSCULAR | Status: AC
  Administered 2020-05-20: 100 ug via INTRAVENOUS
  Filled 2020-05-20: qty 2

## 2020-05-20 MED ORDER — FENTANYL CITRATE (PF) 100 MCG/2ML IJ SOLN: 100.0000 ug | Freq: Once | INTRAMUSCULAR | Status: AC

## 2020-05-20 MED ORDER — KETOROLAC TROMETHAMINE 15 MG/ML IJ SOLN: 15.0000 mg | Freq: Four times a day (QID) | INTRAMUSCULAR | Status: DC | PRN

## 2020-05-20 MED ORDER — POLYETHYLENE GLYCOL 3350 17 G PO PACK: 17.0000 g | PACK | Freq: Every day | ORAL | Status: DC | PRN

## 2020-05-20 MED ORDER — PROPOFOL 10 MG/ML IV BOLUS
INTRAVENOUS | Status: AC
  Filled 2020-05-20: qty 20

## 2020-05-20 MED ORDER — MIDAZOLAM HCL 2 MG/2ML IJ SOLN: 0.5000 mg | Freq: Once | INTRAMUSCULAR | Status: DC | PRN

## 2020-05-20 MED ORDER — METHOCARBAMOL 500 MG PO TABS
500.0000 mg | ORAL_TABLET | Freq: Four times a day (QID) | ORAL | Status: DC
  Administered 2020-05-20: 500 mg via ORAL
  Filled 2020-05-20: qty 1

## 2020-05-20 SURGICAL SUPPLY — 51 items
BENZOIN TINCTURE PRP APPL 2/3 (GAUZE/BANDAGES/DRESSINGS) IMPLANT
BLADE CLIPPER SURG (BLADE) ×3 IMPLANT
CHLORAPREP W/TINT 26 (MISCELLANEOUS) ×3 IMPLANT
COVER SURGICAL LIGHT HANDLE (MISCELLANEOUS) ×3 IMPLANT
COVER WAND RF STERILE (DRAPES) ×3 IMPLANT
DERMABOND ADVANCED (GAUZE/BANDAGES/DRESSINGS) ×2
DERMABOND ADVANCED .7 DNX12 (GAUZE/BANDAGES/DRESSINGS) ×1 IMPLANT
DRAIN PENROSE 1/2X12 LTX STRL (WOUND CARE) ×3 IMPLANT
DRAPE LAPAROSCOPIC ABDOMINAL (DRAPES) IMPLANT
DRAPE LAPAROTOMY TRNSV 102X78 (DRAPES) ×3 IMPLANT
DRSG TEGADERM 4X4.75 (GAUZE/BANDAGES/DRESSINGS) IMPLANT
ELECT CAUTERY BLADE 6.4 (BLADE) ×3 IMPLANT
ELECT REM PT RETURN 9FT ADLT (ELECTROSURGICAL) ×3
ELECTRODE REM PT RTRN 9FT ADLT (ELECTROSURGICAL) ×1 IMPLANT
GAUZE 4X4 16PLY RFD (DISPOSABLE) ×3 IMPLANT
GAUZE SPONGE 4X4 12PLY STRL (GAUZE/BANDAGES/DRESSINGS) IMPLANT
GLOVE BIO SURGEON STRL SZ 6.5 (GLOVE) ×2 IMPLANT
GLOVE BIO SURGEONS STRL SZ 6.5 (GLOVE) ×1
GLOVE BIOGEL PI IND STRL 6 (GLOVE) ×1 IMPLANT
GLOVE BIOGEL PI INDICATOR 6 (GLOVE) ×2
GOWN STRL REUS W/ TWL LRG LVL3 (GOWN DISPOSABLE) ×2 IMPLANT
GOWN STRL REUS W/TWL LRG LVL3 (GOWN DISPOSABLE) ×6
KIT BASIN OR (CUSTOM PROCEDURE TRAY) ×3 IMPLANT
KIT TURNOVER KIT B (KITS) ×3 IMPLANT
LIGASURE IMPACT 36 18CM CVD LR (INSTRUMENTS) ×3 IMPLANT
MESH PROLENE 3X6 (Mesh General) ×3 IMPLANT
NEEDLE HYPO 25GX1X1/2 BEV (NEEDLE) ×3 IMPLANT
NS IRRIG 1000ML POUR BTL (IV SOLUTION) ×3 IMPLANT
PACK GENERAL/GYN (CUSTOM PROCEDURE TRAY) ×3 IMPLANT
PAD ARMBOARD 7.5X6 YLW CONV (MISCELLANEOUS) ×3 IMPLANT
PENCIL SMOKE EVACUATOR (MISCELLANEOUS) ×3 IMPLANT
SPONGE INTESTINAL PEANUT (DISPOSABLE) ×3 IMPLANT
SUT ETHILON 2 0 FS 18 (SUTURE) ×3 IMPLANT
SUT MNCRL AB 4-0 PS2 18 (SUTURE) ×3 IMPLANT
SUT NOVA NAB GS-21 0 18 T12 DT (SUTURE) ×3 IMPLANT
SUT PDS AB 0 CT 36 (SUTURE) IMPLANT
SUT SILK 0 FSL (SUTURE) ×3 IMPLANT
SUT SILK 0 SH 30 (SUTURE) IMPLANT
SUT SILK 2 0 SH (SUTURE) IMPLANT
SUT SILK 3 0 (SUTURE)
SUT SILK 3 0 SH 30 (SUTURE) ×3 IMPLANT
SUT SILK 3-0 18XBRD TIE 12 (SUTURE) IMPLANT
SUT VIC AB 0 CT2 27 (SUTURE) ×3 IMPLANT
SUT VIC AB 2-0 SH 27 (SUTURE) ×3
SUT VIC AB 2-0 SH 27X BRD (SUTURE) ×1 IMPLANT
SUT VIC AB 3-0 SH 27 (SUTURE) ×6
SUT VIC AB 3-0 SH 27X BRD (SUTURE) ×1 IMPLANT
SUT VIC AB 3-0 SH 27XBRD (SUTURE) ×1 IMPLANT
SYR CONTROL 10ML LL (SYRINGE) ×3 IMPLANT
TOWEL GREEN STERILE (TOWEL DISPOSABLE) ×3 IMPLANT
TOWEL GREEN STERILE FF (TOWEL DISPOSABLE) ×3 IMPLANT

## 2020-05-20 NOTE — Progress Notes (Signed)
RN gave pt discharge instructions and he stated understanding, iv has been removed and belongings packed. New medications escribed to pts home pharmacy. Work note given to the patient.

## 2020-05-20 NOTE — Anesthesia Postprocedure Evaluation (Signed)
Anesthesia Post Note  Patient: Alex Rivera  Procedure(s) Performed: OPEN RIGHT INGUINAL HERNIA REPAIR WITH MESH (Right Groin)     Patient location during evaluation: PACU Anesthesia Type: General Level of consciousness: awake and alert, patient cooperative and oriented Pain management: pain level controlled Vital Signs Assessment: post-procedure vital signs reviewed and stable Respiratory status: spontaneous breathing, nonlabored ventilation and respiratory function stable Cardiovascular status: blood pressure returned to baseline and stable Postop Assessment: no apparent nausea or vomiting Anesthetic complications: no   No complications documented.  Last Vitals:  Vitals:   05/20/20 0905 05/20/20 1219  BP:  127/76  Pulse: 81 (!) 101  Resp: 20 (!) 9  Temp:  36.7 C  SpO2: 95% 94%    Last Pain:  Vitals:   05/20/20 1222  TempSrc:   PainSc: 5                  Loris Seelye,E. Toshio Slusher

## 2020-05-20 NOTE — Transfer of Care (Signed)
Immediate Anesthesia Transfer of Care Note  Patient: Alex Rivera  Procedure(s) Performed: OPEN RIGHT INGUINAL HERNIA REPAIR WITH MESH (Right Groin)  Patient Location: PACU  Anesthesia Type:GA combined with regional for post-op pain  Level of Consciousness: awake, alert  and oriented  Airway & Oxygen Therapy: Patient Spontanous Breathing  Post-op Assessment: Report given to RN and Post -op Vital signs reviewed and stable  Post vital signs: Reviewed and stable  Last Vitals:  Vitals Value Taken Time  BP 127/76 05/20/20 1219  Temp    Pulse 102 05/20/20 1219  Resp 12 05/20/20 1219  SpO2 97 % 05/20/20 1219  Vitals shown include unvalidated device data.  Last Pain:  Vitals:   05/20/20 0843  TempSrc:   PainSc: 0-No pain         Complications: No complications documented.

## 2020-05-20 NOTE — Anesthesia Preprocedure Evaluation (Addendum)
Anesthesia Evaluation  Patient identified by MRN, date of birth, ID band Patient awake    Reviewed: Allergy & Precautions, NPO status , Patient's Chart, lab work & pertinent test results  History of Anesthesia Complications Negative for: history of anesthetic complications  Airway Mallampati: II  TM Distance: >3 FB Neck ROM: Full    Dental  (+) Missing, Chipped, Dental Advisory Given, Poor Dentition   Pulmonary Current Smoker,  05/19/2020 SARS coronavirus NEG   breath sounds clear to auscultation       Cardiovascular hypertension, Pt. on medications (-) angina Rhythm:Regular Rate:Normal     Neuro/Psych negative neurological ROS     GI/Hepatic Neg liver ROS, Nausea with this hernia   Endo/Other  Morbid obesity  Renal/GU negative Renal ROS     Musculoskeletal   Abdominal (+) + obese,   Peds  Hematology negative hematology ROS (+)   Anesthesia Other Findings   Reproductive/Obstetrics                            Anesthesia Physical Anesthesia Plan  ASA: III  Anesthesia Plan: General   Post-op Pain Management: GA combined w/ Regional for post-op pain   Induction: Intravenous  PONV Risk Score and Plan: 1 and Ondansetron and Dexamethasone  Airway Management Planned: Oral ETT  Additional Equipment: None  Intra-op Plan:   Post-operative Plan: Extubation in OR  Informed Consent: I have reviewed the patients History and Physical, chart, labs and discussed the procedure including the risks, benefits and alternatives for the proposed anesthesia with the patient or authorized representative who has indicated his/her understanding and acceptance.     Dental advisory given  Plan Discussed with: CRNA and Surgeon  Anesthesia Plan Comments: (Plan routine monitors, GETA with TAP block for post op analgesia)       Anesthesia Quick Evaluation

## 2020-05-20 NOTE — Anesthesia Procedure Notes (Signed)
Procedure Name: Intubation Date/Time: 05/20/2020 9:37 AM Performed by: Eligha Bridegroom, CRNA Pre-anesthesia Checklist: Patient identified, Emergency Drugs available, Suction available, Patient being monitored and Timeout performed Patient Re-evaluated:Patient Re-evaluated prior to induction Oxygen Delivery Method: Circle system utilized Preoxygenation: Pre-oxygenation with 100% oxygen Induction Type: IV induction, Rapid sequence and Cricoid Pressure applied Laryngoscope Size: Mac and 4 Grade View: Grade I Tube type: Oral Tube size: 7.5 mm Airway Equipment and Method: Stylet Placement Confirmation: ETT inserted through vocal cords under direct vision Secured at: 22 cm Tube secured with: Tape Dental Injury: Teeth and Oropharynx as per pre-operative assessment

## 2020-05-20 NOTE — Anesthesia Procedure Notes (Signed)
Anesthesia Regional Block: TAP block   Pre-Anesthetic Checklist: ,, timeout performed, Correct Patient, Correct Site, Correct Laterality, Correct Procedure, Correct Position, site marked, Risks and benefits discussed,  Surgical consent,  Pre-op evaluation,  At surgeon's request and post-op pain management  Laterality: Right  Prep: chloraprep       Needles:  Injection technique: Single-shot  Needle Type: Echogenic Needle     Needle Length: 9cm  Needle Gauge: 21     Additional Needles:   Procedures:,,,, ultrasound used (permanent image in chart),,,,  Narrative:  Start time: 05/20/2020 8:51 AM End time: 05/20/2020 8:59 AM Injection made incrementally with aspirations every 5 mL.  Performed by: Personally  Anesthesiologist: Jairo Ben, MD  Additional Notes: Pt identified in Holding room.  Monitors applied. Working IV access confirmed. Sterile prep R flank.  #21ga ECHOGenic Arrow block needle into TAP with US guidance.  30cc 0.5% Bupivacaine with 1:200k epi injected incrementally after negative test dose.  Patient asymptomatic, VSS, no heme aspirated, tolerated well.  Sandford Craze, MD

## 2020-05-20 NOTE — Discharge Summary (Signed)
[facility] Discharge Summary   Patient ID: Alex Rivera MRN: [id number] DOB/AGE: 26-17-1997 26 y.o.  Admit date: 05/19/2020 Discharge date: 05/20/2020  Admitting Diagnosis: Incarcerated right inguinal hernia  Discharge Diagnosis Incarcerated, fat-containing indirect inguinal hernia  Consultants None  Imaging: No results found.  Procedures Dr. Bedelia Person (05/20/2020) - Open right inguinal hernia repair with mesh  Hospital Course:  Alex Rivera is a 24yo male who [facility] with worsening right groin pain. Known right inguinal hernia for about 1 year.  The previous weekend he had acute worsening pain prompting him to come to the ED. He was seen by EDP, u/s showed right testicle is displaced medially by a fat containing mass likely representing an inguinal hernia, hernia was partially reducible and there was felt to be no indication of strangulation or SBO so he was discharged home and advised to call [facility] for appointment. Due to persistent pain he returned to the ED. Patient was admitted and underwent procedure listed above. Intraoperatively he was noted to have an incarcerated, fat-containing indirect inguinal hernia.  Tolerated procedure well and was transferred to the floor.  Diet was advanced as tolerated.  On POD0 the patient was voiding well, tolerating diet, ambulating well, pain well controlled, vital signs stable, incisions c/d/i and felt stable for discharge home.  Patient will follow up as below and knows to call with questions or concerns.    I have personally reviewed the patients medication history on the [place] controlled substance database.     Allergies as of 05/20/2020   No Known Allergies     Medication List    TAKE these medications   acetaminophen 500 MG tablet Commonly known as: TYLENOL Take 2 tablets (1,000 mg total) by mouth every 8 (eight) hours as needed for mild pain.   amLODipine 5 MG tablet Commonly known as:  NORVASC Take 5 mg by mouth daily.   FLUoxetine 20 MG capsule Commonly known as: PROZAC Take 20 mg by mouth daily.   methocarbamol 500 MG tablet Commonly known as: ROBAXIN Take 1 tablet (500 mg total) by mouth every 6 (six) hours as needed for muscle spasms.   multivitamin tablet Take 1 tablet by mouth daily.   oxyCODONE 5 MG immediate release tablet Commonly known as: Oxy IR/ROXICODONE Take 1-2 tablets (5-10 mg total) by mouth every 6 (six) hours as needed for severe pain.   polyethylene glycol 17 g packet Commonly known as: MIRALAX / GLYCOLAX Take 17 g by mouth daily as needed for mild constipation.         Follow-up Information    Diamantina Monks, MD. Call.   Specialty: Surgery Why: We are working on your appointment, call to confirm Please arrive 30 minutes prior to your appointment to check in and fill out paperwork. Bring photo ID and insurance information. Contact information: [address] [place] [address] [phone]               Signed: Franne Forts, [facility] 05/20/2020, 3:03 PM Please see Amion for pager number during day hours 7:00am-4:30pm

## 2020-05-20 NOTE — Discharge Instructions (Signed)
[facility] _______[facility] °INGUINAL HERNIA REPAIR: POST OP INSTRUCTIONS ° °Always review your discharge instruction sheet given to you by the facility where your surgery was performed. °IF YOU HAVE DISABILITY OR FAMILY LEAVE FORMS, YOU MUST BRING THEM TO THE OFFICE FOR PROCESSING.   °DO NOT GIVE THEM TO YOUR DOCTOR. ° °1. A  prescription for pain medication may be given to you upon discharge.  Take your pain medication as prescribed, if needed.  If narcotic pain medicine is not needed, then you may take acetaminophen (Tylenol) or ibuprofen (Advil) as needed. °2. Take your usually prescribed medications unless otherwise directed. °If you need a refill on your pain medication, please contact your pharmacy.  They will contact our office to request authorization. Prescriptions will not be filled after 5 pm or on week-ends. °3. You should follow a light diet the first 24 hours after arrival home, such as soup and crackers, etc.  Be sure to include lots of fluids daily.  Resume your normal diet the day after surgery. °4.Most patients will experience some swelling and bruising around the umbilicus or in the groin and scrotum.  Ice packs and reclining will help.  Swelling and bruising can take several days to resolve.  °6. It is common to experience some constipation if taking pain medication after surgery.  Increasing fluid intake and taking a stool softener (such as Colace) will usually help or prevent this problem from occurring.  A mild laxative (Milk of Magnesia or Miralax) should be taken according to package directions if there are no bowel movements after 48 hours. °7. Unless discharge instructions indicate otherwise, you may remove your bandages 24-48 hours after surgery, and you may shower at that time.  You may have steri-strips (small skin tapes) in place directly over the incision.  These strips should be left on the skin for 7-10 days.  If your surgeon used skin glue on the incision, you may  shower in 24 hours.  The glue will flake off over the next 2-3 weeks.  Any sutures or staples will be removed at the office during your follow-up visit. °8. ACTIVITIES:  You may resume regular (light) daily activities beginning the next day--such as daily self-care, walking, climbing stairs--gradually increasing activities as tolerated.  You may have sexual intercourse when it is comfortable.  Refrain from any heavy lifting or straining until approved by your doctor. ° °a.You may drive when you are no longer taking prescription pain medication, you can comfortably wear a seatbelt, and you can safely maneuver your car and apply brakes. °b.RETURN TO WORK:   °_____________________________________________ ° °9.You should see your doctor in the office for a follow-up appointment approximately 2-3 weeks after your surgery.  Make sure that you call for this appointment within a day or two after you arrive home to insure a convenient appointment time. °10.OTHER INSTRUCTIONS: _________________________ °   _____________________________________ ° °WHEN TO CALL YOUR DOCTOR: °1. Fever over 101.0 °2. Inability to urinate °3. Nausea and/or vomiting °4. Extreme swelling or bruising °5. Continued bleeding from incision. °6. Increased pain, redness, or drainage from the incision ° °The clinic staff is available to answer your questions during regular business hours.  Please don’t hesitate to call and ask to speak to one of the nurses for clinical concerns.  If you have a medical emergency, go to the nearest emergency room or call 911.  A surgeon from [facility] is always on call at the hospital ° ° °[address], [address], [place], [place]  [address] ? ° [address] [address], [place], [place]   [address] °[phone] ? [phone] ? FAX [phone] °Web site: [email or web address] ° °

## 2020-05-20 NOTE — Op Note (Signed)
   Operative Note   Date: 05/20/2020  Procedure: open right inguinal hernia repair with mesh  Pre-op diagnosis: incarcerated right inguinal hernia Post-op diagnosis: incarcerated, fat-containing indirect inguinal hernia  Indication and clinical history: The patient is a 24 y.o. year old male with a large, symptomatic, incarcerated   Surgeon: Diamantina Monks, MD Assistant: Magnus Ivan, MD; [place], [place]  Anesthesiologist: Jean Rosenthal, PA Anesthesia: General  Findings:  . Specimen: none . EBL: 15cc . Drains/Implants: none  Disposition: PACU - hemodynamically stable.  Description of procedure: The patient was positioned supine on the operating room table. General anesthetic induction and intubation were uneventful. The lower abdomen, groin, and scrotum were prepped and draped in the usual sterile fashion. Foley catheter insertion was performed and was atraumatic. Time-out was performed verifying correct patient, procedure, signature of informed consent, and administration of pre-operative antibiotics.   An oblique incision was made 2/3 the distance from the ASIS to the pubic tubercle and deepened through Scarpa's fascia until the external oblique aponeurosis was reached. The aponeurosis was entered sharply and extended medially and laterally using Metzenbaum scissors. The spermatic cord was encircled with a penrose drain and the hernia sac identified in an indirect location. During dissection, the hernia sac was entered, revealing a large amount of omentum. Multiple efforts were made to return this omentum to the abdominal cavity, but were unsuccessful, so the excess omentum was resected using the Ligasure device. The hernia sac was excised after placing a pursestring suture. A 6x9cm mesh was cut to fit and secured to the pubic tubercle medially as well as Cooper's ligament and the shelving edge. The external ring was re-created. The external oblique aponeurosis was closed with 2-0 vicryl suture in a  running fashion. Scarpa's fascia was closed with 3-0 vicryl suture in a running fashion.A 3-0 vicryl suture was used to approximate the deep dermal tissues. The skin was closed with 4-0 monocryl. Additional buttress was performed with 2-0 nylon suture in a vertical mattress technique.  Sterile dressings were applied with telfa and tegaderm. All sponge and instrument counts were correct at the conclusion of the procedure. The scrotum was palpated at the conclusion of the procedure confirming two testicles in the scrotum. The patient was awakened from anesthesia, extubated uneventfully, and transported to the PACU in good condition. There were no complications.   Clinical update attempted to [facility] via phone post-operatively, no answer, left VM.    Diamantina Monks, MD General and Trauma Surgery [facility]

## 2020-05-20 NOTE — Progress Notes (Signed)
General Surgery Follow Up Note  Subjective:    Overnight Issues:   Objective:  Vital signs for last 24 hours: Temp:  [97.6 F (36.4 C)-98.4 F (36.9 C)] 98.3 F (36.8 C) (11/04 0800) Pulse Rate:  [66-95] 81 (11/04 0905) Resp:  [12-20] 20 (11/04 0905) BP: (108-167)/(60-113) 146/81 (11/04 0800) SpO2:  [94 %-100 %] 95 % (11/04 0905)  Hemodynamic parameters for last 24 hours:    Intake/Output from previous day: 11/03 0701 - 11/04 0700 In: 642.5 [P.O.:240; I.V.:402.5] Out: -   Intake/Output this shift: No intake/output data recorded.  Vent settings for last 24 hours:    Physical Exam:  Gen: comfortable, no distress Neuro: non-focal exam HEENT: PERRL Neck: supple CV: RRR Pulm: unlabored breathing Abd: soft, NT GU: large RIH Extr: wwp, no edema   Results for orders placed or performed during the hospital encounter of 05/19/20 (from the past 24 hour(s))  CBC with Differential     Status: Abnormal   Collection Time: 05/19/20 12:24 PM  Result Value Ref Range   WBC 13.3 (H) 4.0 - 10.5 K/uL   RBC 5.55 4.22 - 5.81 MIL/uL   Hemoglobin 16.8 13.0 - 17.0 g/dL   HCT 68.3 39 - 52 %   MCV 88.3 80.0 - 100.0 fL   MCH 30.3 26.0 - 34.0 pg   MCHC 34.3 30.0 - 36.0 g/dL   RDW 41.9 62.2 - 29.7 %   Platelets 320 150 - 400 K/uL   nRBC 0.0 0.0 - 0.2 %   Neutrophils Relative % 78 %   Neutro Abs 10.1 (H) 1.7 - 7.7 K/uL   Lymphocytes Relative 16 %   Lymphs Abs 2.1 0.7 - 4.0 K/uL   Monocytes Relative 5 %   Monocytes Absolute 0.7 0.1 - 1.0 K/uL   Eosinophils Relative 1 %   Eosinophils Absolute 0.2 0.0 - 0.5 K/uL   Basophils Relative 0 %   Basophils Absolute 0.1 0.0 - 0.1 K/uL   Immature Granulocytes 0 %   Abs Immature Granulocytes 0.05 0.00 - 0.07 K/uL  Respiratory Panel by RT PCR (Flu A&B, Covid) - Nasopharyngeal Swab     Status: None   Collection Time: 05/19/20  1:00 PM   Specimen: Nasopharyngeal Swab  Result Value Ref Range   SARS Coronavirus 2 by RT PCR NEGATIVE NEGATIVE     Influenza A by PCR NEGATIVE NEGATIVE   Influenza B by PCR NEGATIVE NEGATIVE  Comprehensive metabolic panel     Status: Abnormal   Collection Time: 05/19/20  2:36 PM  Result Value Ref Range   Sodium 136 135 - 145 mmol/L   Potassium 5.5 (H) 3.5 - 5.1 mmol/L   Chloride 102 98 - 111 mmol/L   CO2 23 22 - 32 mmol/L   Glucose, Bld 76 70 - 99 mg/dL   BUN 8 6 - 20 mg/dL   Creatinine, Ser 9.89 0.61 - 1.24 mg/dL   Calcium 9.2 8.9 - 21.1 mg/dL   Total Protein 7.0 6.5 - 8.1 g/dL   Albumin 4.0 3.5 - 5.0 g/dL   AST 54 (H) 15 - 41 U/L   ALT 54 (H) 0 - 44 U/L   Alkaline Phosphatase 57 38 - 126 U/L   Total Bilirubin 1.8 (H) 0.3 - 1.2 mg/dL   GFR, Estimated >94 >17 mL/min   Anion gap 11 5 - 15  HIV Antibody (routine testing w rflx)     Status: None   Collection Time: 05/19/20  6:21 PM  Result Value  Ref Range   HIV Screen 4th Generation wRfx Non Reactive Non Reactive    Assessment & Plan:  Present on Admission: **None**    LOS: 0 days   Additional comments:I reviewed the patient's new clinical lab test results.   and I reviewed the patients new imaging test results.    RIH - open RIHR with mesh today. Consented 11/3 by me. No additional questions this AM. Site marked.  FEN - NPO  DVT - SCDs Dispo - possibly home this PM   Diamantina Monks, MD Trauma & General Surgery Please use [email or web address] to contact on call provider  05/20/2020  *Care during the described time interval was provided by me. I have reviewed this patient's available data, including medical history, events of note, physical examination and test results as part of my evaluation.

## 2020-05-21 ENCOUNTER — Encounter (HOSPITAL_COMMUNITY): Payer: Self-pay | Admitting: Surgery

## 2020-06-01 ENCOUNTER — Other Ambulatory Visit: Payer: Self-pay | Admitting: Surgery

## 2020-06-01 ENCOUNTER — Encounter (HOSPITAL_COMMUNITY): Payer: Self-pay

## 2020-06-01 ENCOUNTER — Other Ambulatory Visit: Payer: Self-pay

## 2020-06-01 ENCOUNTER — Ambulatory Visit (HOSPITAL_COMMUNITY)
Admission: RE | Admit: 2020-06-01 | Discharge: 2020-06-01 | Disposition: A | Discharge status: Home / Self Care | Payer: BC Managed Care – PPO | Source: Ambulatory Visit | Attending: Surgery | Admitting: Surgery

## 2020-06-01 ENCOUNTER — Other Ambulatory Visit (HOSPITAL_COMMUNITY): Payer: Self-pay | Admitting: Surgery

## 2020-06-01 DIAGNOSIS — L02214 Cutaneous abscess of groin: Secondary | ICD-10-CM | POA: Diagnosis not present

## 2020-06-01 MED ORDER — IOHEXOL 300 MG/ML  SOLN
100.0000 mL | Freq: Once | INTRAMUSCULAR | Status: AC | PRN
  Administered 2020-06-01: 100 mL via INTRAVENOUS

## 2020-06-04 ENCOUNTER — Other Ambulatory Visit: Payer: Self-pay | Admitting: Surgery

## 2020-06-04 ENCOUNTER — Other Ambulatory Visit (HOSPITAL_COMMUNITY): Payer: Self-pay | Admitting: Surgery

## 2020-06-04 DIAGNOSIS — L02214 Cutaneous abscess of groin: Secondary | ICD-10-CM

## 2020-12-27 ENCOUNTER — Other Ambulatory Visit: Payer: Self-pay

## 2020-12-27 ENCOUNTER — Emergency Department (HOSPITAL_COMMUNITY)
Admission: EM | Admit: 2020-12-27 | Discharge: 2020-12-27 | Disposition: A | Discharge status: Home / Self Care | Payer: No Typology Code available for payment source | Attending: Emergency Medicine | Admitting: Emergency Medicine

## 2020-12-27 DIAGNOSIS — Z0189 Encounter for other specified special examinations: Secondary | ICD-10-CM | POA: Diagnosis not present

## 2020-12-27 DIAGNOSIS — Z5321 Procedure and treatment not carried out due to patient leaving prior to being seen by health care provider: Secondary | ICD-10-CM | POA: Insufficient documentation

## 2020-12-27 NOTE — ED Notes (Signed)
Registration handed this NT patients labels and stated that the patient left

## 2021-01-10 ENCOUNTER — Other Ambulatory Visit: Payer: Self-pay | Admitting: Ophthalmology

## 2021-01-10 DIAGNOSIS — H532 Diplopia: Secondary | ICD-10-CM

## 2021-01-25 IMAGING — CT CT ABD-PELV W/ CM
2 of 4 series · 15 of 46 positions shown, 17 images · IV contrast (APPLIED)
Comparison: Ultrasound 05/15/2020

CLINICAL DATA: History of right inguinal hernia pair on 05/22/2020.
Pain and swelling.

EXAM:
CT ABDOMEN AND PELVIS WITH CONTRAST
TECHNIQUE: Multidetector CT imaging of the abdomen and pelvis was performed
using the standard protocol following bolus administration of
intravenous contrast.
CONTRAST:  100mL OMNIPAQUE IOHEXOL 300 MG/ML  SOLN

[Series 2: axial st · axial · 0.98mm/px · z∈[-692,-77]mm · 12 of 139 slices shown, 14 images]
[im 8/139  soft-tissue]
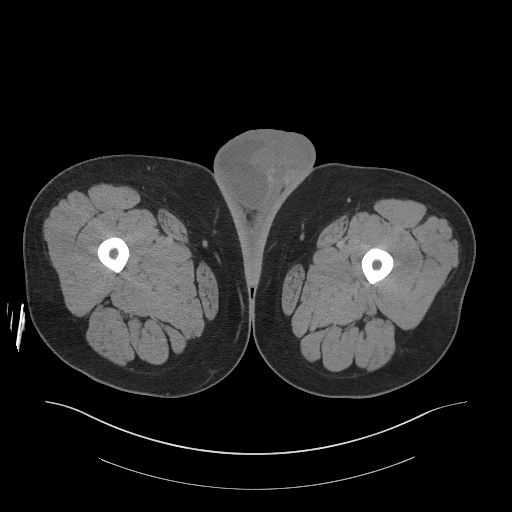
[im 8/139  bone]
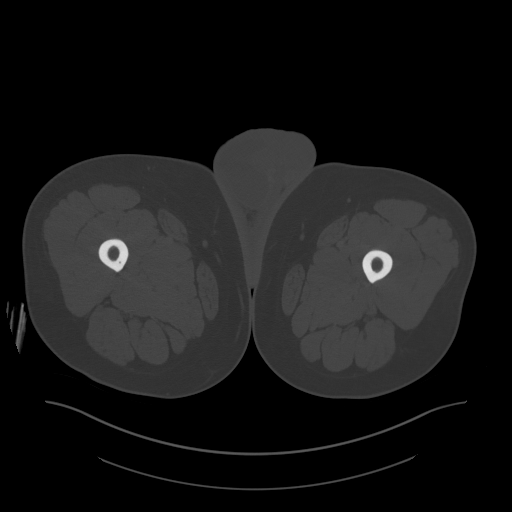
[im 22/139  soft-tissue]
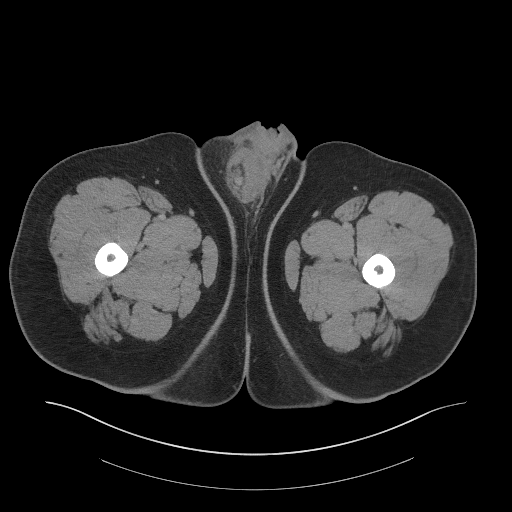
[im 30/139  soft-tissue]
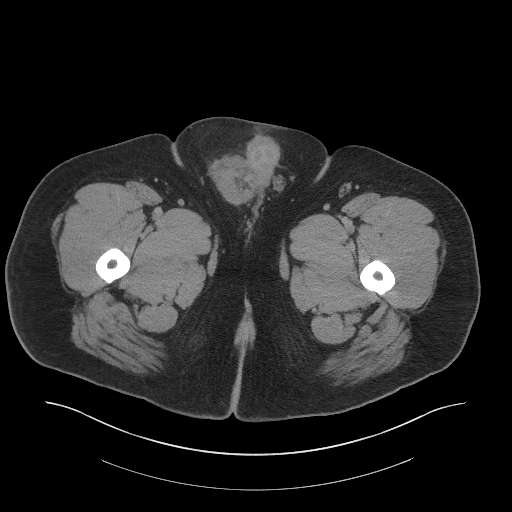
[im 44/139  soft-tissue]
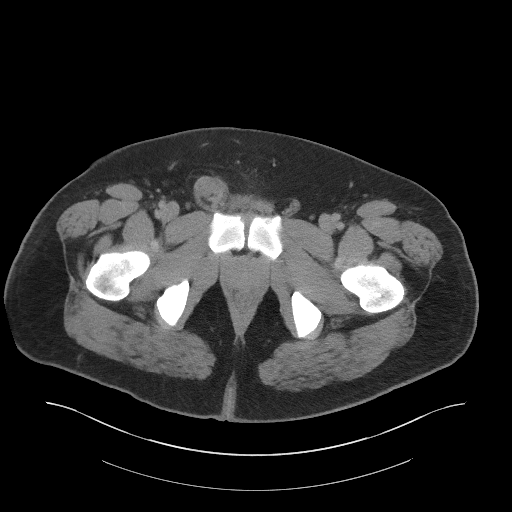
[im 51/139  soft-tissue]
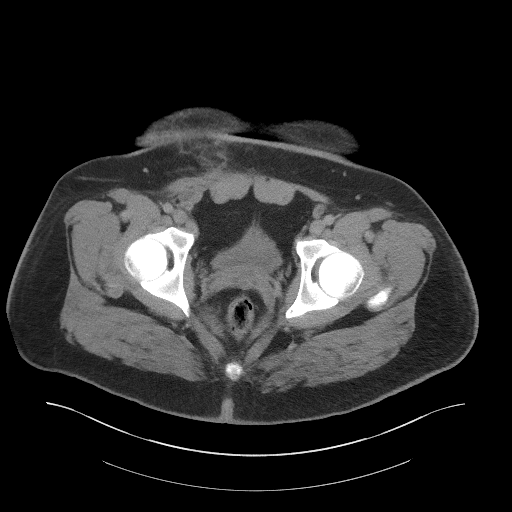
[im 66/139  soft-tissue]
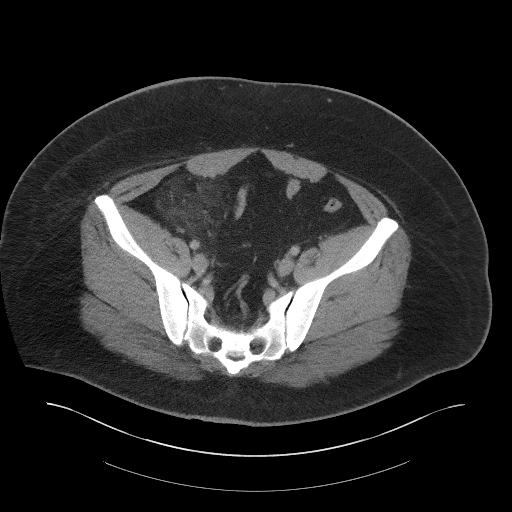
[im 73/139  soft-tissue]
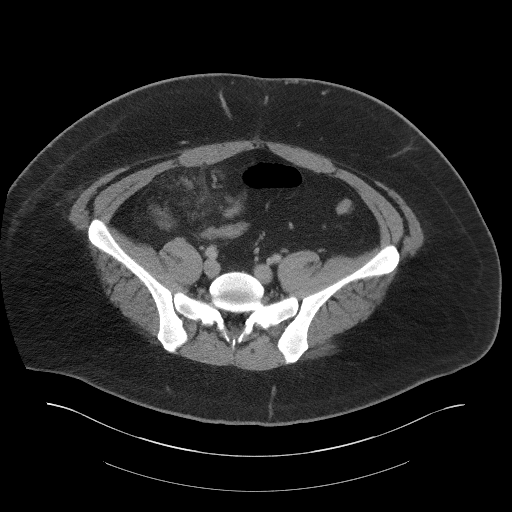
[im 88/139  soft-tissue]
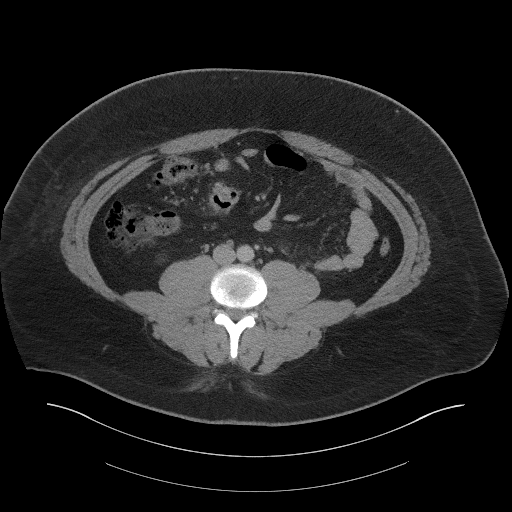
[im 95/139  soft-tissue]
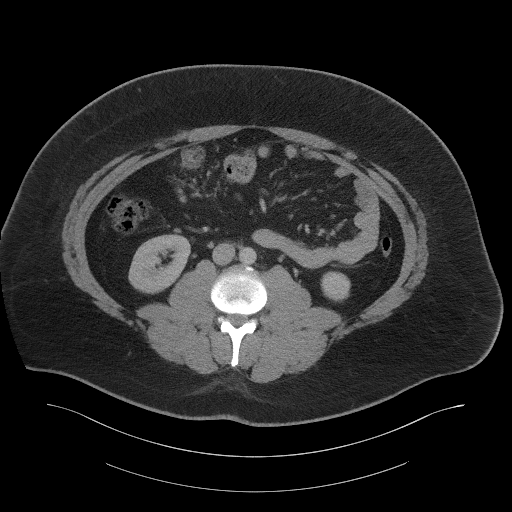
[im 95/139  bone]
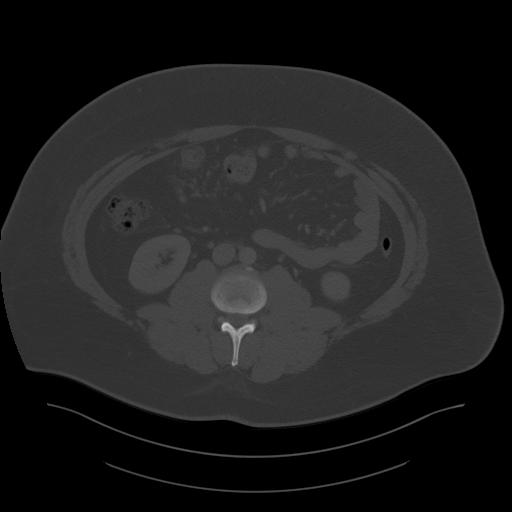
[im 109/139  soft-tissue]
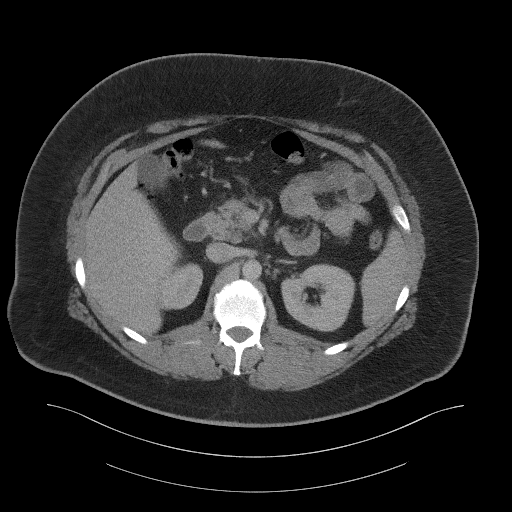
[im 117/139  soft-tissue]
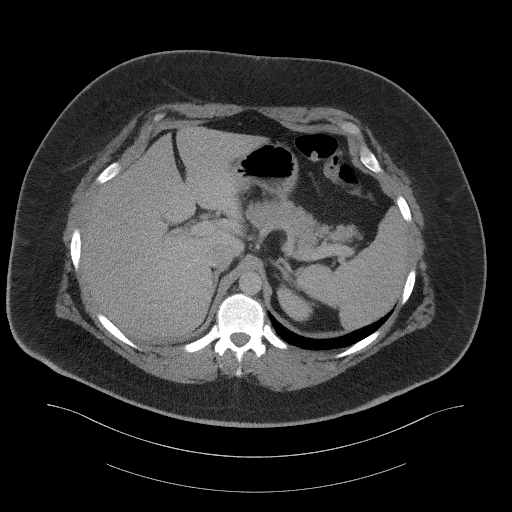
[im 131/139  soft-tissue]
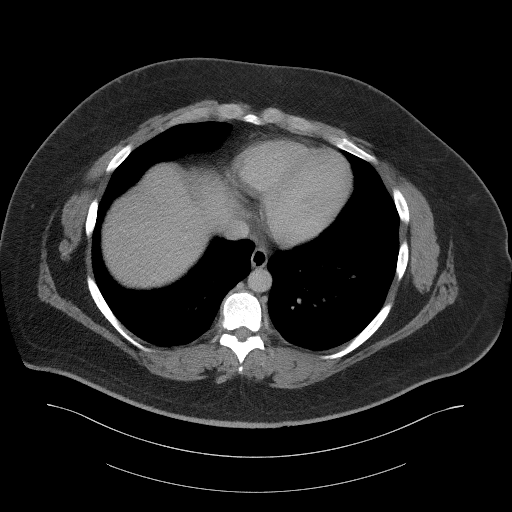

[Series 5: coronal st · coronal · 0.94mm/px · 3 of 123 slices shown]
[im 41/123  soft-tissue]
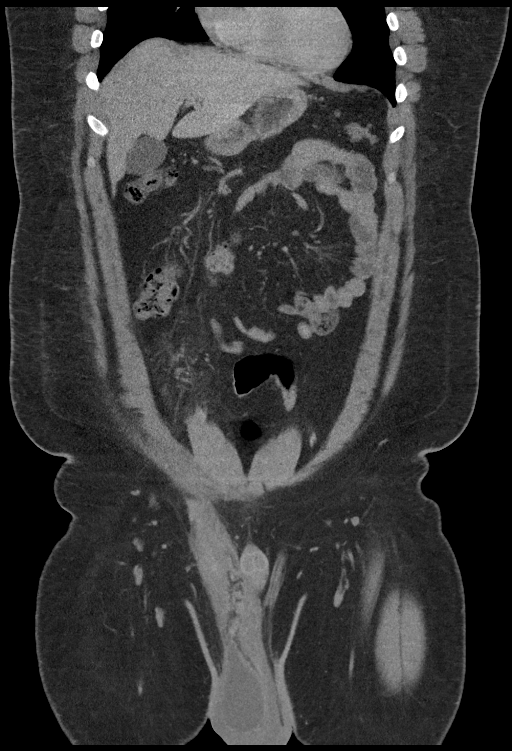
[im 55/123  soft-tissue]
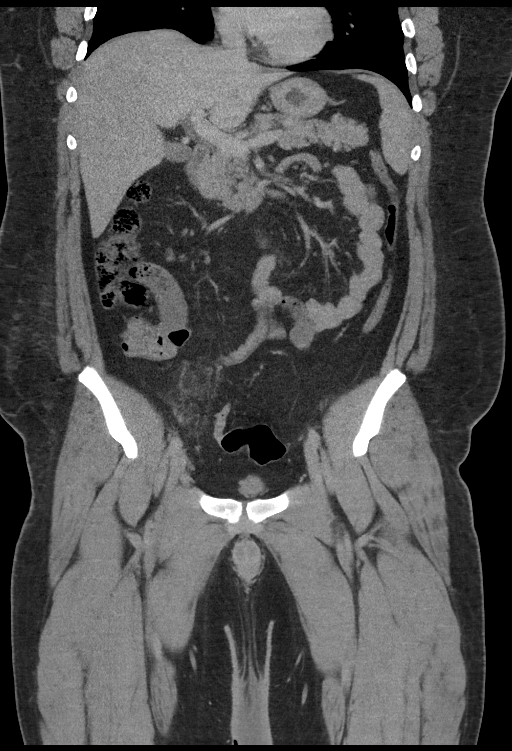
[im 68/123  soft-tissue]
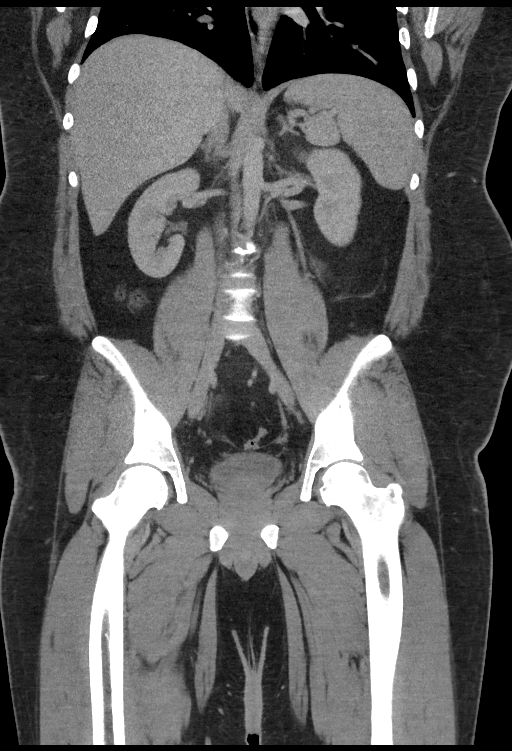

[15 of 46 positions shown; findings below may reference images not displayed]

FINDINGS: Lower chest: The lung bases are clear of acute process. No pleural
effusion or pulmonary lesions. The heart is normal in size. No
pericardial effusion. The distal esophagus and aorta are
unremarkable.

Hepatobiliary: No focal hepatic lesions or intrahepatic biliary
dilatation. The gallbladder is normal. No common bile duct
dilatation.

Pancreas: No mass, inflammation or ductal dilatation.

Spleen: Normal size.  Focal lesions.

Adrenals/Urinary Tract: The adrenal glands and kidneys are normal.
The bladder is normal.

Stomach/Bowel: The stomach, duodenum, small bowel and colon are
unremarkable. No acute inflammatory changes, mass lesions or
obstructive findings. The terminal ileum is normal. The appendix is
normal.

Vascular/Lymphatic: The aorta and branch vessels are patent. No
mesenteric or retroperitoneal mass or adenopathy. Scattered lymph
nodes are noted.

Reproductive: The prostate gland and seminal vesicles are normal.

Other: Surgical changes from recent right inguinal hernia repair.
There is extensive soft tissue thickening in the inguinal canal
which is most likely a hematoma. This also extends down into the
right hemiscrotum with there is a moderate-sized right hydrocele.
There are also dilated vessels in the inguinal canal. I do not see a
recurrent hernia.

There is a area of hazy interstitial change in the right lower
quadrant mesentery. This could be some type of postoperative change
or possibly a mesenteric infarct. No mass or abscess.

Musculoskeletal: No significant bony findings.
IMPRESSION: 1. Surgical changes from recent right inguinal hernia repair. There
is extensive soft tissue thickening in the inguinal canal which is
most likely a hematoma. This also extends down into the right
hemiscrotum with a moderate-sized right hydrocele. Prominent
vasculature also noted in the inguinal canal.
2. No recurrent hernia is identified.
3. Hazy interstitial change in the right lower quadrant mesentery
could be some type of postoperative change or possibly a mesenteric
infarct.

## 2021-05-30 ENCOUNTER — Other Ambulatory Visit: Payer: Self-pay | Admitting: Student

## 2021-05-30 ENCOUNTER — Other Ambulatory Visit (HOSPITAL_COMMUNITY): Payer: Self-pay | Admitting: Student

## 2021-05-30 DIAGNOSIS — R1031 Right lower quadrant pain: Secondary | ICD-10-CM

## 2021-05-31 ENCOUNTER — Encounter (HOSPITAL_COMMUNITY): Payer: Self-pay

## 2021-05-31 ENCOUNTER — Ambulatory Visit (HOSPITAL_COMMUNITY)
Admission: RE | Admit: 2021-05-31 | Discharge: 2021-05-31 | Disposition: A | Discharge status: Home / Self Care | Payer: No Typology Code available for payment source | Source: Ambulatory Visit | Attending: Student | Admitting: Student

## 2021-05-31 ENCOUNTER — Other Ambulatory Visit: Payer: Self-pay

## 2021-05-31 DIAGNOSIS — R1031 Right lower quadrant pain: Secondary | ICD-10-CM | POA: Diagnosis present

## 2021-05-31 LAB — POCT I-STAT CREATININE: Creatinine, Ser: 0.9 mg/dL (ref 0.61–1.24)

## 2021-05-31 MED ORDER — IOHEXOL 350 MG/ML SOLN
75.0000 mL | Freq: Once | INTRAVENOUS | Status: AC | PRN
  Administered 2021-05-31: 75 mL via INTRAVENOUS

## 2021-08-26 ENCOUNTER — Ambulatory Visit: Payer: Self-pay | Admitting: Surgery

## 2021-08-30 ENCOUNTER — Encounter (HOSPITAL_BASED_OUTPATIENT_CLINIC_OR_DEPARTMENT_OTHER): Payer: Self-pay | Admitting: Surgery

## 2021-08-31 ENCOUNTER — Encounter (HOSPITAL_BASED_OUTPATIENT_CLINIC_OR_DEPARTMENT_OTHER): Payer: Self-pay | Admitting: Surgery

## 2021-08-31 ENCOUNTER — Other Ambulatory Visit: Payer: Self-pay

## 2021-08-31 NOTE — Progress Notes (Addendum)
Spoke w/ via phone for pre-op interview---pt Lab needs dos---- none              Lab results------09/01/21 Lab appt for BMP, EKG, & blood pressure check COVID test -----patient states asymptomatic no test needed Arrive at -------0715 on Friday, September 02, 2021 NPO after MN NO Solid Food.  Clear liquids from MN until---0615 Med rec completed Medications to take morning of surgery -----Amlodipine, Levothyroxine Diabetic medication -----n/a Patient instructed no nail polish to be worn day of surgery Patient instructed to bring photo id and insurance card day of surgery Patient aware to have Driver (ride ) / caregiver    for 24 hours after surgery - girlfriend's father - Ileene Musa Patient Special Instructions -----No vaping 24 hours before surgery. Pre-Op special Istructions -----none Patient verbalized understanding of instructions that were given at this phone interview. Patient denies shortness of breath, chest pain, fever, cough at this phone interview.   Patient has hypertension. He follows with PCP Dr. Shellia Carwin w/[facility]. His LOV was 05/27/21. He was instructed to follow-up in 4 weeks. Patient stated that he has not followed-up due to financial concerns. He states that he does check his BP at home and that it has been running high. He is scheduled to come in for a lab appt on 09/01/21 for a blood pressure check, BMP & EKG. I was unable to get in touch with this patient until 08/31/21.

## 2021-09-01 ENCOUNTER — Encounter (HOSPITAL_COMMUNITY)
Admission: RE | Admit: 2021-09-01 | Discharge: 2021-09-01 | Disposition: A | Discharge status: Home / Self Care | Payer: No Typology Code available for payment source | Source: Ambulatory Visit | Attending: Surgery | Admitting: Surgery

## 2021-09-01 DIAGNOSIS — Z01818 Encounter for other preprocedural examination: Secondary | ICD-10-CM | POA: Insufficient documentation

## 2021-09-01 LAB — BASIC METABOLIC PANEL
Anion gap: 5 (ref 5–15)
BUN: 11 mg/dL (ref 6–20)
CO2: 27 mmol/L (ref 22–32)
Calcium: 9.3 mg/dL (ref 8.9–10.3)
Chloride: 106 mmol/L (ref 98–111)
Creatinine, Ser: 0.95 mg/dL (ref 0.61–1.24)
GFR, Estimated: >60 mL/min (ref >60)
Glucose, Bld: 94 mg/dL (ref 70–99)
Potassium: 4.1 mmol/L (ref 3.5–5.1)
Sodium: 138 mmol/L (ref 135–145)

## 2021-09-02 ENCOUNTER — Other Ambulatory Visit: Payer: Self-pay

## 2021-09-02 ENCOUNTER — Encounter (HOSPITAL_BASED_OUTPATIENT_CLINIC_OR_DEPARTMENT_OTHER): Payer: Self-pay | Admitting: Surgery

## 2021-09-02 ENCOUNTER — Ambulatory Visit (HOSPITAL_BASED_OUTPATIENT_CLINIC_OR_DEPARTMENT_OTHER)
Admission: RE | Admit: 2021-09-02 | Discharge: 2021-09-02 | Disposition: A | Discharge status: Home / Self Care | Payer: No Typology Code available for payment source | Source: Ambulatory Visit | Attending: Surgery | Admitting: Surgery

## 2021-09-02 ENCOUNTER — Ambulatory Visit (HOSPITAL_BASED_OUTPATIENT_CLINIC_OR_DEPARTMENT_OTHER): Payer: No Typology Code available for payment source | Admitting: Certified Registered"

## 2021-09-02 ENCOUNTER — Encounter (HOSPITAL_BASED_OUTPATIENT_CLINIC_OR_DEPARTMENT_OTHER)
Admission: RE | Disposition: A | Discharge status: Home / Self Care | Payer: Self-pay | Source: Ambulatory Visit | Attending: Surgery

## 2021-09-02 DIAGNOSIS — K4021 Bilateral inguinal hernia, without obstruction or gangrene, recurrent: Secondary | ICD-10-CM

## 2021-09-02 DIAGNOSIS — Z87891 Personal history of nicotine dependence: Secondary | ICD-10-CM | POA: Insufficient documentation

## 2021-09-02 DIAGNOSIS — I10 Essential (primary) hypertension: Secondary | ICD-10-CM | POA: Diagnosis not present

## 2021-09-02 DIAGNOSIS — Z01818 Encounter for other preprocedural examination: Secondary | ICD-10-CM

## 2021-09-02 DIAGNOSIS — X58XXXA Exposure to other specified factors, initial encounter: Secondary | ICD-10-CM | POA: Insufficient documentation

## 2021-09-02 DIAGNOSIS — S31103A Unspecified open wound of abdominal wall, right lower quadrant without penetration into peritoneal cavity, initial encounter: Secondary | ICD-10-CM

## 2021-09-02 DIAGNOSIS — K4091 Unilateral inguinal hernia, without obstruction or gangrene, recurrent: Secondary | ICD-10-CM | POA: Diagnosis not present

## 2021-09-02 DIAGNOSIS — Z6841 Body Mass Index (BMI) 40.0 and over, adult: Secondary | ICD-10-CM | POA: Insufficient documentation

## 2021-09-02 DIAGNOSIS — T8149XA Infection following a procedure, other surgical site, initial encounter: Secondary | ICD-10-CM

## 2021-09-02 DIAGNOSIS — T8141XA Infection following a procedure, superficial incisional surgical site, initial encounter: Secondary | ICD-10-CM | POA: Diagnosis not present

## 2021-09-02 DIAGNOSIS — E039 Hypothyroidism, unspecified: Secondary | ICD-10-CM | POA: Insufficient documentation

## 2021-09-02 HISTORY — DX: Presence of spectacles and contact lenses: Z97.3

## 2021-09-02 HISTORY — DX: Pneumonia, unspecified organism: J18.9

## 2021-09-02 HISTORY — DX: Essential (primary) hypertension: I10

## 2021-09-02 HISTORY — DX: Cardiac murmur, unspecified: R01.1

## 2021-09-02 HISTORY — PX: INGUINAL HERNIA REPAIR: SHX194

## 2021-09-02 HISTORY — DX: Hypothyroidism, unspecified: E03.9

## 2021-09-02 SURGERY — REPAIR, HERNIA, INGUINAL, LAPAROSCOPIC
Anesthesia: General | Site: Abdomen | Laterality: Right

## 2021-09-02 MED ORDER — PROPOFOL 10 MG/ML IV BOLUS
INTRAVENOUS | Status: DC | PRN
  Administered 2021-09-02: 200 mg via INTRAVENOUS

## 2021-09-02 MED ORDER — LIDOCAINE 2% (20 MG/ML) 5 ML SYRINGE
INTRAMUSCULAR | Status: DC | PRN
  Administered 2021-09-02: 60 mg via INTRAVENOUS

## 2021-09-02 MED ORDER — GABAPENTIN 300 MG PO CAPS
ORAL_CAPSULE | ORAL | Status: AC
  Filled 2021-09-02: qty 1

## 2021-09-02 MED ORDER — ROCURONIUM BROMIDE 10 MG/ML (PF) SYRINGE
PREFILLED_SYRINGE | INTRAVENOUS | Status: AC
  Filled 2021-09-02: qty 10

## 2021-09-02 MED ORDER — MIDAZOLAM HCL 2 MG/2ML IJ SOLN
INTRAMUSCULAR | Status: AC
  Filled 2021-09-02: qty 2

## 2021-09-02 MED ORDER — CEFAZOLIN IN SODIUM CHLORIDE 3-0.9 GM/100ML-% IV SOLN
3.0000 g | Freq: Once | INTRAVENOUS | Status: AC
  Administered 2021-09-02: 3 g via INTRAVENOUS

## 2021-09-02 MED ORDER — DEXMEDETOMIDINE (PRECEDEX) IN NS 20 MCG/5ML (4 MCG/ML) IV SYRINGE
PREFILLED_SYRINGE | INTRAVENOUS | Status: DC | PRN
  Administered 2021-09-02: 20 ug via INTRAVENOUS

## 2021-09-02 MED ORDER — HYDROMORPHONE HCL 2 MG/ML IJ SOLN
INTRAMUSCULAR | Status: AC
  Filled 2021-09-02: qty 1

## 2021-09-02 MED ORDER — LIDOCAINE HCL (PF) 2 % IJ SOLN
INTRAMUSCULAR | Status: AC
  Filled 2021-09-02: qty 5

## 2021-09-02 MED ORDER — ACETAMINOPHEN 500 MG PO TABS
1000.0000 mg | ORAL_TABLET | ORAL | Status: AC
  Administered 2021-09-02: 1000 mg via ORAL

## 2021-09-02 MED ORDER — BUPIVACAINE LIPOSOME 1.3 % IJ SUSP: 20.0000 mL | Freq: Once | INTRAMUSCULAR | Status: DC

## 2021-09-02 MED ORDER — GABAPENTIN 300 MG PO CAPS
300.0000 mg | ORAL_CAPSULE | ORAL | Status: AC
  Administered 2021-09-02: 300 mg via ORAL

## 2021-09-02 MED ORDER — ONDANSETRON HCL 4 MG/2ML IJ SOLN
INTRAMUSCULAR | Status: DC | PRN
  Administered 2021-09-02: 4 mg via INTRAVENOUS

## 2021-09-02 MED ORDER — ACETAMINOPHEN 500 MG PO TABS
ORAL_TABLET | ORAL | Status: AC
  Filled 2021-09-02: qty 2

## 2021-09-02 MED ORDER — ROCURONIUM BROMIDE 100 MG/10ML IV SOLN
INTRAVENOUS | Status: DC | PRN
  Administered 2021-09-02: 60 mg via INTRAVENOUS
  Administered 2021-09-02: 20 mg via INTRAVENOUS
  Administered 2021-09-02: 10 mg via INTRAVENOUS
  Administered 2021-09-02 (×2): 20 mg via INTRAVENOUS

## 2021-09-02 MED ORDER — KETAMINE HCL 50 MG/5ML IJ SOSY
PREFILLED_SYRINGE | INTRAMUSCULAR | Status: AC
  Filled 2021-09-02: qty 5

## 2021-09-02 MED ORDER — HYDROMORPHONE HCL 1 MG/ML IJ SOLN
0.2500 mg | INTRAMUSCULAR | Status: DC | PRN
  Administered 2021-09-02 (×2): 0.5 mg via INTRAVENOUS

## 2021-09-02 MED ORDER — EPHEDRINE 5 MG/ML INJ
INTRAVENOUS | Status: AC
  Filled 2021-09-02: qty 5

## 2021-09-02 MED ORDER — CHLORHEXIDINE GLUCONATE CLOTH 2 % EX PADS: 6.0000 | MEDICATED_PAD | Freq: Once | CUTANEOUS | Status: DC

## 2021-09-02 MED ORDER — CEFAZOLIN SODIUM-DEXTROSE 2-4 GM/100ML-% IV SOLN: 2.0000 g | INTRAVENOUS | Status: DC

## 2021-09-02 MED ORDER — FENTANYL CITRATE (PF) 100 MCG/2ML IJ SOLN
INTRAMUSCULAR | Status: DC | PRN
Start: 2021-09-02 — End: 2021-09-02
  Administered 2021-09-02: 100 ug via INTRAVENOUS
  Administered 2021-09-02 (×3): 50 ug via INTRAVENOUS

## 2021-09-02 MED ORDER — DEXAMETHASONE SODIUM PHOSPHATE 10 MG/ML IJ SOLN
INTRAMUSCULAR | Status: DC | PRN
  Administered 2021-09-02: 10 mg via INTRAVENOUS

## 2021-09-02 MED ORDER — BUPIVACAINE LIPOSOME 1.3 % IJ SUSP
INTRAMUSCULAR | Status: DC | PRN
  Administered 2021-09-02: 20 mL

## 2021-09-02 MED ORDER — GLYCOPYRROLATE PF 0.2 MG/ML IJ SOSY
PREFILLED_SYRINGE | INTRAMUSCULAR | Status: AC
  Filled 2021-09-02: qty 1

## 2021-09-02 MED ORDER — HYDROMORPHONE HCL 1 MG/ML IJ SOLN
INTRAMUSCULAR | Status: DC | PRN
  Administered 2021-09-02: 1 mg via INTRAVENOUS

## 2021-09-02 MED ORDER — PROPOFOL 10 MG/ML IV BOLUS
INTRAVENOUS | Status: AC
  Filled 2021-09-02: qty 20

## 2021-09-02 MED ORDER — CELECOXIB 200 MG PO CAPS
400.0000 mg | ORAL_CAPSULE | ORAL | Status: AC
  Administered 2021-09-02: 400 mg via ORAL

## 2021-09-02 MED ORDER — CELECOXIB 200 MG PO CAPS
ORAL_CAPSULE | ORAL | Status: AC
  Filled 2021-09-02: qty 2

## 2021-09-02 MED ORDER — CEFAZOLIN IN SODIUM CHLORIDE 3-0.9 GM/100ML-% IV SOLN
INTRAVENOUS | Status: AC
  Filled 2021-09-02: qty 100

## 2021-09-02 MED ORDER — SUGAMMADEX SODIUM 200 MG/2ML IV SOLN
INTRAVENOUS | Status: DC | PRN
  Administered 2021-09-02: 300 mg via INTRAVENOUS

## 2021-09-02 MED ORDER — CEFAZOLIN SODIUM-DEXTROSE 2-4 GM/100ML-% IV SOLN
INTRAVENOUS | Status: AC
  Filled 2021-09-02: qty 100

## 2021-09-02 MED ORDER — DEXMEDETOMIDINE (PRECEDEX) IN NS 20 MCG/5ML (4 MCG/ML) IV SYRINGE
PREFILLED_SYRINGE | INTRAVENOUS | Status: AC
  Filled 2021-09-02: qty 5

## 2021-09-02 MED ORDER — FENTANYL CITRATE (PF) 250 MCG/5ML IJ SOLN
INTRAMUSCULAR | Status: AC
  Filled 2021-09-02: qty 5

## 2021-09-02 MED ORDER — BUPIVACAINE HCL 0.5 % IJ SOLN
INTRAMUSCULAR | Status: DC | PRN
  Administered 2021-09-02: 30 mL

## 2021-09-02 MED ORDER — EPHEDRINE SULFATE-NACL 50-0.9 MG/10ML-% IV SOSY
PREFILLED_SYRINGE | INTRAVENOUS | Status: DC | PRN
  Administered 2021-09-02: 5 mg via INTRAVENOUS

## 2021-09-02 MED ORDER — MIDAZOLAM HCL 5 MG/5ML IJ SOLN
INTRAMUSCULAR | Status: DC | PRN
  Administered 2021-09-02: 2 mg via INTRAVENOUS

## 2021-09-02 MED ORDER — DEXAMETHASONE SODIUM PHOSPHATE 10 MG/ML IJ SOLN
INTRAMUSCULAR | Status: AC
  Filled 2021-09-02: qty 1

## 2021-09-02 MED ORDER — ONDANSETRON HCL 4 MG/2ML IJ SOLN
INTRAMUSCULAR | Status: AC
  Filled 2021-09-02: qty 2

## 2021-09-02 MED ORDER — TRAMADOL HCL 50 MG PO TABS: 50.0000 mg | ORAL_TABLET | Freq: Four times a day (QID) | ORAL | 0 refills | Status: AC | PRN

## 2021-09-02 MED ORDER — HYDROMORPHONE HCL 1 MG/ML IJ SOLN
INTRAMUSCULAR | Status: AC
  Filled 2021-09-02: qty 1

## 2021-09-02 MED ORDER — KETAMINE HCL 10 MG/ML IJ SOLN
INTRAMUSCULAR | Status: DC | PRN
  Administered 2021-09-02 (×5): 10 mg via INTRAVENOUS

## 2021-09-02 MED ORDER — LACTATED RINGERS IV SOLN: INTRAVENOUS | Status: DC

## 2021-09-02 MED ORDER — SUCCINYLCHOLINE CHLORIDE 200 MG/10ML IV SOSY
PREFILLED_SYRINGE | INTRAVENOUS | Status: DC | PRN
  Administered 2021-09-02: 140 mg via INTRAVENOUS

## 2021-09-02 MED ORDER — GLYCOPYRROLATE 0.2 MG/ML IJ SOLN
INTRAMUSCULAR | Status: DC | PRN
  Administered 2021-09-02: .2 mg via INTRAVENOUS

## 2021-09-02 MED ORDER — SUGAMMADEX SODIUM 500 MG/5ML IV SOLN
INTRAVENOUS | Status: AC
  Filled 2021-09-02: qty 5

## 2021-09-02 SURGICAL SUPPLY — 42 items
BLADE CLIPPER SENSICLIP SURGIC (BLADE) IMPLANT
CABLE HIGH FREQUENCY MONO STRZ (ELECTRODE) ×2 IMPLANT
CHLORAPREP W/TINT 26 (MISCELLANEOUS) ×4 IMPLANT
COVER SURGICAL LIGHT HANDLE (MISCELLANEOUS) ×1 IMPLANT
DECANTER SPIKE VIAL GLASS SM (MISCELLANEOUS) IMPLANT
DERMABOND ADVANCED (GAUZE/BANDAGES/DRESSINGS) ×2
DERMABOND ADVANCED .7 DNX12 (GAUZE/BANDAGES/DRESSINGS) ×1 IMPLANT
ELECT REM PT RETURN 9FT ADLT (ELECTROSURGICAL) ×2
ELECTRODE REM PT RTRN 9FT ADLT (ELECTROSURGICAL) ×1 IMPLANT
GAUZE 4X4 16PLY ~~LOC~~+RFID DBL (SPONGE) ×2 IMPLANT
GLOVE SRG 8 PF TXTR STRL LF DI (GLOVE) ×1 IMPLANT
GLOVE SURG ENC MOIS LTX SZ7.5 (GLOVE) ×4 IMPLANT
GLOVE SURG UNDER POLY LF SZ8 (GLOVE) ×6
GOWN STRL REUS W/ TWL XL LVL3 (GOWN DISPOSABLE) ×1 IMPLANT
GOWN STRL REUS W/TWL LRG LVL3 (GOWN DISPOSABLE) ×4 IMPLANT
GOWN STRL REUS W/TWL XL LVL3 (GOWN DISPOSABLE) ×8
GRASPER SUT TROCAR 14GX15 (MISCELLANEOUS) ×1 IMPLANT
IRRIG SUCT STRYKERFLOW 2 WTIP (MISCELLANEOUS)
IRRIGATION SUCT STRKRFLW 2 WTP (MISCELLANEOUS) IMPLANT
KIT TURNOVER CYSTO (KITS) ×2 IMPLANT
MESH 3DMAX 5X7 LT XLRG (Mesh General) ×1 IMPLANT
MESH 3DMAX 5X7 RT XLRG (Mesh General) ×1 IMPLANT
NEEDLE INSUFFLATION 120MM (ENDOMECHANICALS) ×2 IMPLANT
NS IRRIG 1000ML POUR BTL (IV SOLUTION) ×1 IMPLANT
PACK BASIN DAY SURGERY FS (CUSTOM PROCEDURE TRAY) ×2 IMPLANT
PAD POSITIONING PINK XL (MISCELLANEOUS) ×2 IMPLANT
RELOAD STAPLE 4.0 BLU F/HERNIA (INSTRUMENTS) IMPLANT
RELOAD STAPLE 4.8 BLK F/HERNIA (STAPLE) IMPLANT
RELOAD STAPLE HERNIA 4.0 BLUE (INSTRUMENTS) ×2 IMPLANT
RELOAD STAPLE HERNIA 4.8 BLK (STAPLE) ×2 IMPLANT
SCISSORS LAP 5X35 DISP (ENDOMECHANICALS) ×2 IMPLANT
SET TUBE SMOKE EVAC HIGH FLOW (TUBING) ×2 IMPLANT
SPONGE T-LAP 18X18 ~~LOC~~+RFID (SPONGE) ×1 IMPLANT
STAPLER HERNIA 12 8.5 360D (INSTRUMENTS) ×1 IMPLANT
SUT MNCRL AB 4-0 PS2 18 (SUTURE) ×3 IMPLANT
SUT VICRYL 0 UR6 27IN ABS (SUTURE) IMPLANT
TOWEL OR 17X26 10 PK STRL BLUE (TOWEL DISPOSABLE) ×2 IMPLANT
TRAY FOL W/BAG SLVR 16FR STRL (SET/KITS/TRAYS/PACK) IMPLANT
TRAY FOLEY W/BAG SLVR 16FR LF (SET/KITS/TRAYS/PACK) ×2
TRAY LAPAROSCOPIC (CUSTOM PROCEDURE TRAY) ×2 IMPLANT
TROCAR BLADELESS OPT 12M 100M (ENDOMECHANICALS) ×2 IMPLANT
TROCAR BLADELESS OPT 5 100 (ENDOMECHANICALS) ×4 IMPLANT

## 2021-09-02 NOTE — Op Note (Signed)
Patient: Alex Rivera (Dec 08, 1995, [id number])  Date of Surgery: 09/02/2021   Preoperative Diagnosis: RECURRENT RIGHT INGUINAL HERNIA, CHRONIC WOUND RIGHT GROIN   Postoperative Diagnosis: RECURRENT RIGHT INGUINAL HERNIA, INITIAL LEFT INGUINAL HERNIA, CHRONIC WOUND RIGHT GROIN FROM [facility] ABSCESS  Surgical Procedure: LAPAROSCOPIC BILATERAL INGUINAL HERNIA REPAIR WITH MESH, OPEN RIGHT GROIN WOUND EXPLORATION: [id number]   Operative Team Members:  Surgeon(s) and Role:    * Karleigh Bunte, Hyman Hopes, MD - Primary   Anesthesiologist: Gaynelle Adu, MD CRNA: Marny Lowenstein, CRNA; Pearson Grippe, CRNA   Anesthesia: General   Fluids:  Total I/O In: 700 [I.V.:600; IV Piggyback:100] Out: -   Complications: None  Drains:  None  Specimen: None  Disposition:  PACU - hemodynamically stable.  Plan of Care: Discharge to home after PACU  Indications for Procedure: Alex Rivera is a 26 y.o. male who presented with a recurrent right inguinal hernia and a chronic right groin wound.   It appears he may have a stitch abscess of his right groin because the stitches grew into his skin very quickly postoperatively. He has chronic drainage from the lateral aspect of his wound. This may represent something more sinister like a mesh infection. I recommend wound exploration to remove any foreign body in this area and ensure there is no sinus tracking down to his previously placed right inguinal mesh.  On exam he has a clearly recurrent right inguinal hernia. He has the same symptoms as he did prior to his initial surgery. I recommended laparoscopic repair of the hernia. Hopefully if he has a simple stitch abscess, we would be able to complete this all at the same time. This would be a laparoscopic recurrent right inguinal hernia repair with mesh. We discussed this in detail. We discussed the procedure itself as well as its risk, benefits, and alternatives. After a full discussion all  questions answered the patient granted consent to proceed. We will have the surgery schedulers reach out to the patient to schedule surgery.   Findings:  Technique: Transabdominal preperitoneal (TAPP) Hernia Location: Recurrent right indirect inguinal hernia.  Initial left indirect inguinal hernia Mesh Size &Type:  Bard 3D Max extra large left and Bard 3D Max extra large right inguinal hernia Mesh Fixation: Endo-Universal hernia stapler  Infection status: Patient: Private Patient Elective Case Case: Elective Infection Present At Time Of Surgery (PATOS):  stitch abscess in the right groin wound   Description of Procedure:  The patient's abdomen was prepped and draped in usual sterile fashion.  A timeout was completed verifying the correct patient, procedure, position, and equipment needed for the case.  I made an elliptical incision around the chronic right groin wound.  We bovied through the area using cut electrocautery and identified a nylon suture with a knot.  We extended the incision medially slightly to ensure there was no other retained foreign body.  With the old stitch abscess removed, we closed the incision using running 4-0 Monocryl suture.  We then undraped, reprepped and redraped.   The patient was positioned supine, padded and secured to the bed, with both arms tucked.  The abdomen was widely prepped and draped.  A time out procedure was performed.  A 1 cm infraumbilical incision was made.  The abdomen was entered without trauma to the underlying viscera.  The abdomen was insufflated to 15 mm of Hg.  A 12 mm trocar was inserted at the periumbilical incision.  Additional 5 mm trocars were placed in the left and right  abdomen.  There was no trauma to the underlying viscera.  There was an indirect recurrent hernia on the RIGHT.  Utilizing a transabdominal pre peritoneal technique (TAPP), a horizontal incision was made in the peritoneum, immediately below the umbilicus.  Dissection was  carried out in the pre peritoneal space down to the level of the hernia sac which I attempted to reduced into the peritoneal cavity.  Due to the reoperative nature of the operation, the patient's hernia sac was unable to be fully reduced back into the abdomen so it was divided and some of the sac was left in the right inguinal canal.  We were careful to protect the vas deferens and cord and were able to continue the dissection in the preperitoneal plane.  The cord contents were parietalized and preserved.  A large pre peritoneal dissection was performed to uncover the direct, indirect, femoral and obturator spaces.  Coopers ligament was uncovered medially and the psoas muscle uncovered laterally.  The mesh, as documented above, was opened and advanced into the pre peritoneal position so that it more than adequately covered the indirect, direct, femoral and obturator spaces.  The mesh laid flat, with no inferior folds and covered the entire myopectineal orifice.  The mesh was fixated with the endo-universal hernia stapler to Coopers ligament and the posterior aspect of the rectus muscle.  The peritoneal flap was closed with the same device.  There were no peritoneal defects or exposed mesh at the conclusion.  There was an small indirect hernia on the left.  He did not have any symptoms in this area, however I am afraid that this would develop into a larger issue and he may need surgery for this in the future so I decided to proceed with left-sided inguinal hernia repair today.  Utilizing a transabdominal pre peritoneal technique (TAPP), a horizontal incision was made in the peritoneum, immediately below the umbilicus.  Dissection was carried out in the pre peritoneal space down to the level of the hernia sac which was reduced into the peritoneal cavity completely.  The cord contents were parietalized and preserved.  A large pre peritoneal dissection was performed to uncover the direct, indirect, femoral and  obturator spaces.  Coopers ligament was uncovered medially and the psoas muscle uncovered laterally.  The mesh, as documented above, was opened and advanced into the pre peritoneal position so that it more than adequately covered the indirect, direct, femoral and obturator spaces.  The mesh laid flat, with no inferior folds and covered the entire myopectineal orifice.  The mesh was fixated with the endo-universal hernia stapler to Coopers ligament and the posterior aspect of the rectus muscle.  The peritoneal flap was closed with the same device.  There were no peritoneal defects or exposed mesh at the conclusion.  The umbilical trocar was removed and the fascial defect was closed with a 0 Vicryl suture.  The peritoneal cavity was completely desufflated, the trocars removed and the skin closed with 4-0 Monocryl subcuticular suture and skin glue.  All sponge and needle counts were correct at the end of the case.  Ivar Drape, MD General, Bariatric, & Minimally Invasive Surgery [facility], [place]
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
## 2021-09-02 NOTE — H&P (Signed)
Admitting Physician: Nickola Major Charlie Seda  Service: General Surgery  CC: chronic wound after hernia repair.  Subjective   HPI: Notes from previous office visits: Alex Rivera is a 26 y.o. male who underwent emergent open lichtenstein style right inguinal hernia repair with mesh for incarcerated, fat-containing hernia on 05/20/20 by Dr. Bobbye Morton. At his post-op visit, he had subjective fevers and a tender bulge in right groin. He underwent CT on 06/01/20 notable for post-op hematoma. He developed redness and was placed on abx by PCP. He followed up with Dr. Bobbye Morton in Dec 2021, at which time he was improving. He was told to follow-up in one more month, but has not been seen since then.   He is presenting to urgent office with concerns of drainage from the incision. States it has been red mixed with something else, but only drains minimally over the past year. He is not sure if there is a permanent suture left in place. He states over the past few months he has noticed a bulge in the area of the initial hernia and even feels it push out when he coughs. He states the area is tender, especially to touch. He denies fever and issues with his bowel movements. Denies nausea and vomiting.  He returned to discuss with a surgeon: He continues to have a small amount of drainage from the lateral aspect of his open inguinal incision. He feels there is something right underneath the skin that might be causing this. Whenever he does heavy lifting or strenuous activity he feels similar pain and discomfort as he had prior to the inguinal hernia repair and has bulging palpable down into his groin. He felt well for about 6 months after surgery, and then for the last 6 months he has had the symptoms return.   Past Medical History:  Diagnosis Date   Heart murmur    as a teenager, pt states that it has never given him a problem 08/31/21   Hypertension    Patient is taking Norvasc & Benicar. He follows with PCP Dr.  Ileana Roup with [facility].   Hypothyroidism    Patient is on levothyroxine.   Pneumonia    as a child   Wears glasses     Past Surgical History:  Procedure Laterality Date   FOOT SURGERY     ingrown toenail as a child   INGUINAL HERNIA REPAIR Right 05/20/2020   Procedure: OPEN RIGHT INGUINAL HERNIA REPAIR WITH MESH;  Surgeon: Jesusita Oka, MD;  Location: [facility] OR;  Service: General;  Laterality: Right;    Family History  Problem Relation Age of Onset   Hypertension Mother    Cancer Father     Social:  reports that he quit smoking about 2 years ago. His smoking use included cigarettes. He has never used smokeless tobacco. He reports that he does not currently use alcohol. He reports current drug use. Drug: Marijuana.  Allergies: No Known Allergies  Medications: Current Outpatient Medications  Medication Instructions   amLODipine (NORVASC) 5 mg, Oral, Daily   levothyroxine (SYNTHROID) 100 mcg, Oral, Daily before breakfast   Multiple Vitamin (MULTIVITAMIN) tablet 1 tablet, Oral, Daily   olmesartan (BENICAR) 5 MG tablet Oral, Daily   polyethylene glycol (MIRALAX / GLYCOLAX) 17 g, Oral, Daily PRN    ROS - all of the below systems have been reviewed with the patient and positives are indicated with bold text General: chills, fever or night sweats Eyes: blurry vision or double vision ENT: epistaxis  or sore throat Allergy/Immunology: itchy/watery eyes or nasal congestion Hematologic/Lymphatic: bleeding problems, blood clots or swollen lymph nodes Endocrine: temperature intolerance or unexpected weight changes Breast: new or changing breast lumps or nipple discharge Resp: cough, shortness of breath, or wheezing CV: chest pain or dyspnea on exertion GI: as per HPI GU: dysuria, trouble voiding, or hematuria MSK: joint pain or joint stiffness Neuro: TIA or stroke symptoms Derm: pruritus and skin lesion changes Psych: anxiety and depression  Objective   PE Blood pressure (!)  147/94, pulse 82, temperature 98.7 F (37.1 C), temperature source Oral, resp. rate 18, height 6' (1.829 m), weight (!) 150 kg, SpO2 100 %. General appearance - Consistent with stated age. Normal posture. Voice Normal. Mental status - alert and oriented Integumentary - No rash or lesion on limited exam Head - Normocephalic, atraumatic Face - Strength and tone intact Eyes - extraocular movement intact, sclera anicteric Chest - quiet, even and easy respiratory effort with no use of accessory muscles Neurological - able to articulate well with normal speech/language, rate, volume and coherence. Mood/affect - normal Judgement and insight - insight is appropriate concerning matters relevant to self and the patient displays appropriate judgment regarding every day activities. Thought Processes/Cognitive Function - aware of current events. Musculoskeletal - strength symmetrical throughout, no deformity Abdomen -palpable large right inguinal hernia recurrence. Incision with lateral aspect indurated and tender with some thin fluid drainage.   Results for orders placed or performed during the hospital encounter of 09/01/21 (from the past 24 hour(s))  Basic metabolic panel per protocol     Status: None   Collection Time: 09/01/21  2:04 PM  Result Value Ref Range   Sodium 138 135 - 145 mmol/L   Potassium 4.1 3.5 - 5.1 mmol/L   Chloride 106 98 - 111 mmol/L   CO2 27 22 - 32 mmol/L   Glucose, Bld 94 70 - 99 mg/dL   BUN 11 6 - 20 mg/dL   Creatinine, Ser 0.95 0.61 - 1.24 mg/dL   Calcium 9.3 8.9 - 10.3 mg/dL   GFR, Estimated >60 >60 mL/min   Anion gap 5 5 - 15    Imaging Orders    Scrotal ultrasound 05/15/20: 1. The right testicle is displaced medially by a fat containing mass, likely representing an inguinal hernia. 2. Normal appearance of the left testicle. 3. Mild left varicocele. 4. Nonvisualization of the right epididymis.  CT abd/pel 06/01/20: 1. Surgical changes from recent right  inguinal hernia repair. There is extensive soft tissue thickening in the inguinal canal which is most likely a hematoma. This also extends down into the right hemiscrotum with a moderate-sized right hydrocele. Prominent vasculature also noted in the inguinal canal. 2. No recurrent hernia is identified. 3. Hazy interstitial change in the right lower quadrant mesentery could be some type of postoperative change or possibly a mesenteric Infarct.  CT abd/pel 05/31/21: 1. No acute abnormality in the abdomen/pelvis. Normal appendix. 2. Prior right inguinal hernia repair without recurrent hernia. The right lower quadrant inflammatory changes on prior CT have resolved. 3. Hepatomegaly and mild hepatic steatosis.      Assessment and Plan   Recurrent right inguinal hernia  Right groin wound, subsequent encounter  It appears he may have a stitch abscess of his right groin because the stitches grew into his skin very quickly postoperatively. He has chronic drainage from the lateral aspect of his wound. This may represent something more sinister like a mesh infection. I recommend wound exploration to remove  any foreign body in this area and ensure there is no sinus tracking down to his previously placed right inguinal mesh.  On exam he has a clearly recurrent right inguinal hernia. He has the same symptoms as he did prior to his initial surgery. I recommended laparoscopic repair of the surgery. Hopefully if he has a simple stitch abscess, we would be able to complete this all at the same time. This would be a laparoscopic recurrent right inguinal hernia repair with mesh. We discussed this in detail. We discussed the procedure itself as well as its risk, benefits, and alternatives. After a full discussion all questions answered the patient granted consent to proceed. We will have the surgery schedulers reach out to the patient to schedule surgery.    Felicie Morn, MD  [facility] Use [email or web address] to contact on call provider

## 2021-09-02 NOTE — Discharge Instructions (Addendum)
 GROIN HERNIA REPAIR POST OPERATIVE INSTRUCTIONS  Thinking Clearly  The anesthesia may cause you to feel different for 1 or 2 days. Do not drive, drink alcohol, or make any big decisions for at least 2 days.  Nutrition When you wake up, you will be able to drink small amounts of liquid. If you do not feel sick, you can slowly advance your diet to regular foods. Continue to drink lots of fluids, usually about 8 to 10 glasses per day. Eat a high-fiber diet so you don't strain during bowel movements. High-Fiber Foods Foods high in fiber include beans, bran cereals and whole-grain breads, peas, dried fruit (figs, apricots, and dates), raspberries, blackberries, strawberries, sweet corn, broccoli, baked potatoes with skin, plums, pears, apples, greens, and nuts. Activity Slowly increase your activity. Be sure to get up and walk every hour or so to prevent blood clots. No heavy lifting or strenuous activity for 4 weeks following surgery to prevent hernias at your incision sites or recurrence of your hernia. It is normal to feel tired. You may need more sleep than usual.  Get your rest but make sure to get up and move around frequently to prevent blood clots and pneumonia.  Work and Return to School You can go back to work when you feel well enough. Discuss the timing with your surgeon. You can usually go back to school or work 1 week or less after an laparoscopic or an open repair. If your work requires heavy lifting or strenuous activity you need to be placed on light duty for 4 weeks following surgery. You can return to gym class, sports or other physical activities 4 weeks after surgery.  Wound Care You may experience significant bruising in the groin including into the scrotum in males.  Rest, elevating the groin and scrotum above the level of the heart, ice and compression with tight fitting underwear can help.  Always wash your hands before and after touching near your incision site. Do  not soak in a bathtub until cleared at your follow up appointment. You may take a shower 24 hours after surgery. A small amount of drainage from the incision is normal. If the drainage is thick and yellow or the site is red, you may have an infection, so call your surgeon. If you have a drain in one of your incisions, it will be taken out in office when the drainage stops. Steri-Strips will fall off in 7 to 10 days or they will be removed during your first office visit. If you have dermabond glue covering over the incision, allow the glue to flake off on its own. Protect the new skin, especially from the sun. The sun can burn and cause darker scarring. Your scar will heal in about 4 to 6 weeks and will become softer and continue to fade over the next year.  The cosmetic appearance of the incisions will improve over the course of the first year after surgery. Sensation around your incision will return in a few weeks or months.  Bowel Movements After intestinal surgery, you may have loose watery stools for several days. If watery diarrhea lasts longer than 3 days, contact your surgeon. Pain medication (narcotics) can cause constipation. Increase the fiber in your diet with high-fiber foods if you are constipated. You can take an over the counter stool softener like Colace to avoid constipation.  Additional over the counter medications can also be used if Colace isn't sufficient (for example, Milk of Magnesia or Miralax).    Pain The amount of pain is different for each person. Some people need only 1 to 3 doses of pain control medication, while others need more. Take alternating doses of tylenol and ibuprofen around the clock for the first five days following surgery.  This will provide a baseline of pain control and help with inflammation.  Take the narcotic pain medication in addition if needed for severe pain.  Contact Your Surgeon at [phone], if you have: Pain that will not go away Pain that  gets worse A fever of more than 101F (38.3C) Repeated vomiting Swelling, redness, bleeding, or bad-smelling drainage from your wound site Strong abdominal pain No bowel movement or unable to pass gas for 3 days Watery diarrhea lasting longer than 3 days  Pain Control The goal of pain control is to minimize pain, keep you moving and help you heal. Your surgical team will work with you on your pain plan. Most often a combination of therapies and medications are used to control your pain. You may also be given medication (local anesthetic) at the surgical site. This may help control your pain for several days. Extreme pain puts extra stress on your body at a time when your body needs to focus on healing. Do not wait until your pain has reached a level "10" or is unbearable before telling your doctor or nurse. It is much easier to control pain before it becomes severe. Following a laparoscopic procedure, pain is sometimes felt in the shoulder. This is due to the gas inserted into your abdomen during the procedure. Moving and walking helps to decrease the gas and the right shoulder pain.  Use the guide below for ways to manage your post-operative pain. Learn more by going to facs.org/safepaincontrol.  How Intense Is My Pain Common Therapies to Feel Better       I hardly notice my pain, and it does not interfere with my activities.  I notice my pain and it distracts me, but I can still do activities (sitting up, walking, standing).  Non-Medication Therapies  Ice (in a bag, applied over clothing at the surgical site), elevation, rest, meditation, massage, distraction (music, TV, play) walking and mild exercise Splinting the abdomen with pillows +  Non-Opioid Medications Acetaminophen (Tylenol) Non-steroidal anti-inflammatory drugs (NSAIDS) Aspirin, Ibuprofen (Motrin, Advil) Naproxen (Aleve) Take these as needed, when you feel pain. Both acetaminophen and NSAIDs help to decrease pain  and swelling (inflammation).      My pain is hard to ignore and is more noticeable even when I rest.  My pain interferes with my usual activities.  Non-Medication Therapies  +  Non-Opioid medications  Take on a regular schedule (around-the-clock) instead of as needed. (For example, Tylenol every 6 hours at 9:00 am, 3:00 pm, 9:00 pm, 3:00 am and Motrin every 6 hours at 12:00 am, 6:00 am, 12:00 pm, 6:00 pm)         I am focused on my pain, and I am not doing my daily activities.  I am groaning in pain, and I cannot sleep. I am unable to do anything.  My pain is as bad as it could be, and nothing else matters.  Non-Medication Therapies  +  Around-the-Clock Non-Opioid Medications  +  Short-acting opioids  Opioids should be used with other medications to manage severe pain. Opioids block pain and give a feeling of euphoria (feel high). Addiction, a serious side effect of opioids, is rare with short-term (a few days) use.  Examples of short-acting opioids   include: Tramadol (Ultram), Hydrocodone (Norco, Vicodin), Hydromorphone (Dilaudid), Oxycodone (Oxycontin)     The above directions have been adapted from the [employer] Surgical Patient Education Program.  Please refer to the ACS website if needed: PreferredVet.ca.ashx   Louanna Raw, MD [facility], [place], [place], [place], [place]  [address] ?  [address] [place], [place]   [address] [phone] ? [phone] ? FAX [phone] Web site: [email or web address]  Post Anesthesia Home Care Instructions  Activity: Get plenty of rest for the remainder of the day. A responsible individual must stay with you for 24 hours following the procedure.  For the next 24 hours, DO NOT: -Drive a car -Paediatric nurse -Drink alcoholic beverages -Take any medication unless instructed by your  physician -Make any legal decisions or sign important papers.  Meals: Start with liquid foods such as gelatin or soup. Progress to regular foods as tolerated. Avoid greasy, spicy, heavy foods. If nausea and/or vomiting occur, drink only clear liquids until the nausea and/or vomiting subsides. Call your physician if vomiting continues.  Special Instructions/Symptoms: Your throat may feel dry or sore from the anesthesia or the breathing tube placed in your throat during surgery. If this causes discomfort, gargle with warm salt water. The discomfort should disappear within 24 hours.  Do not take Tylenol or NSAID until after 1:15 p.m.

## 2021-09-02 NOTE — Anesthesia Postprocedure Evaluation (Signed)
Anesthesia Post Note  Patient: Alex Rivera  Procedure(s) Performed: LAPAROSCOPIC BILATERAL INGUINAL HERNIA REPAIR WITH MESH, OPEN RIGHT GROIN WOUND EXPLORATION (Right: Abdomen)     Patient location during evaluation: PACU Anesthesia Type: General Level of consciousness: awake and alert Pain management: pain level controlled Vital Signs Assessment: post-procedure vital signs reviewed and stable Respiratory status: spontaneous breathing, nonlabored ventilation and respiratory function stable Cardiovascular status: blood pressure returned to baseline and stable Postop Assessment: no apparent nausea or vomiting Anesthetic complications: no   No notable events documented.  Last Vitals:  Vitals:   09/02/21 1208 09/02/21 1215  BP:  129/81  Pulse: 81 87  Resp: 16 15  Temp:    SpO2: 97% 92%    Last Pain:  Vitals:   09/02/21 1215  TempSrc:   PainSc: 4                  Nayleah Gamel,W. EDMOND

## 2021-09-02 NOTE — Progress Notes (Signed)
RN spoke with Dr Dossie Der patient requesting pain medication for home use.  MD will call in Tramadol.

## 2021-09-02 NOTE — Transfer of Care (Signed)
Immediate Anesthesia Transfer of Care Note  Patient: ELAI VANWYK  Procedure(s) Performed: LAPAROSCOPIC BILATERAL INGUINAL HERNIA REPAIR WITH MESH, OPEN RIGHT GROIN WOUND EXPLORATION (Right: Abdomen)  Patient Location: PACU  Anesthesia Type:General  Level of Consciousness: awake, alert  and oriented  Airway & Oxygen Therapy: Patient Spontanous Breathing and Patient connected to face mask oxygen  Post-op Assessment: Report given to RN and Post -op Vital signs reviewed and stable  Post vital signs: Reviewed and stable  Last Vitals:  Vitals Value Taken Time  BP 179/117 09/02/21 1148  Temp    Pulse 90 09/02/21 1151  Resp 18 09/02/21 1151  SpO2 99 % 09/02/21 1151  Vitals shown include unvalidated device data.  Last Pain:  Vitals:   09/02/21 0709  TempSrc: Oral  PainSc: 0-No pain      Patients Stated Pain Goal: 6 (09/02/21 0709)  Complications: No notable events documented.

## 2021-09-02 NOTE — Anesthesia Preprocedure Evaluation (Addendum)
Anesthesia Evaluation  Patient identified by MRN, date of birth, ID band Patient awake    Reviewed: Allergy & Precautions, H&P , NPO status , Patient's Chart, lab work & pertinent test results  Airway Mallampati: III  TM Distance: >3 FB Neck ROM: Full    Dental no notable dental hx. (+) Teeth Intact, Dental Advisory Given   Pulmonary neg pulmonary ROS, former smoker,    Pulmonary exam normal breath sounds clear to auscultation       Cardiovascular hypertension, Pt. on medications  Rhythm:Regular Rate:Normal     Neuro/Psych negative neurological ROS  negative psych ROS   GI/Hepatic negative GI ROS, Neg liver ROS,   Endo/Other  Hypothyroidism Morbid obesity  Renal/GU negative Renal ROS  negative genitourinary   Musculoskeletal   Abdominal   Peds  Hematology negative hematology ROS (+)   Anesthesia Other Findings   Reproductive/Obstetrics negative OB ROS                            Anesthesia Physical Anesthesia Plan  ASA: 3  Anesthesia Plan: General   Post-op Pain Management: Tylenol PO (pre-op)* and Celebrex PO (pre-op)*   Induction: Intravenous  PONV Risk Score and Plan: 3 and Ondansetron, Dexamethasone and Midazolam  Airway Management Planned: Oral ETT  Additional Equipment:   Intra-op Plan:   Post-operative Plan: Extubation in OR  Informed Consent: I have reviewed the patients History and Physical, chart, labs and discussed the procedure including the risks, benefits and alternatives for the proposed anesthesia with the patient or authorized representative who has indicated his/her understanding and acceptance.     Dental advisory given  Plan Discussed with: CRNA  Anesthesia Plan Comments:         Anesthesia Quick Evaluation

## 2021-09-05 ENCOUNTER — Encounter (HOSPITAL_BASED_OUTPATIENT_CLINIC_OR_DEPARTMENT_OTHER): Payer: Self-pay | Admitting: Surgery

## 2021-09-30 ENCOUNTER — Encounter (HOSPITAL_BASED_OUTPATIENT_CLINIC_OR_DEPARTMENT_OTHER): Payer: Self-pay

## 2021-09-30 ENCOUNTER — Ambulatory Visit (HOSPITAL_BASED_OUTPATIENT_CLINIC_OR_DEPARTMENT_OTHER): Admit: 2021-09-30 | Payer: No Typology Code available for payment source | Admitting: Surgery

## 2021-09-30 SURGERY — REPAIR, HERNIA, INGUINAL, LAPAROSCOPIC
Anesthesia: General | Laterality: Right

## 2022-01-24 IMAGING — CT CT ABD-PELV W/ CM
2 of 4 series · 16 of 46 positions shown, 18 images · IV contrast (APPLIED)
Comparison: CT 06/01/2020

CLINICAL DATA: Right lower quadrant abdominal pain. Patient reports
pain for months. Constipation. Right inguinal hernia repair 1 year
ago.

EXAM:
CT ABDOMEN AND PELVIS WITH CONTRAST
TECHNIQUE: Multidetector CT imaging of the abdomen and pelvis was performed
using the standard protocol following bolus administration of
intravenous contrast.
CONTRAST:  75mL OMNIPAQUE IOHEXOL 350 MG/ML SOLN

[Series 2: axial st · axial · 0.98mm/px · z∈[-619,-104]mm · 13 of 117 slices shown, 15 images]
[im 7/117  soft-tissue]
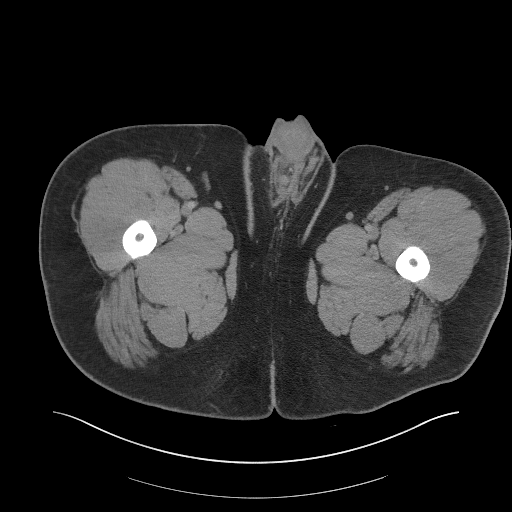
[im 7/117  bone]
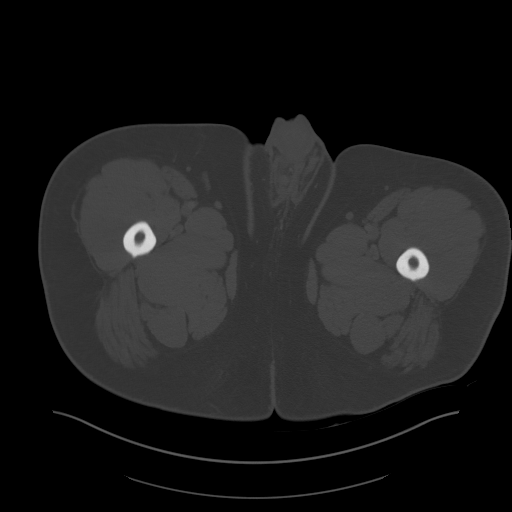
[im 13/117  soft-tissue]
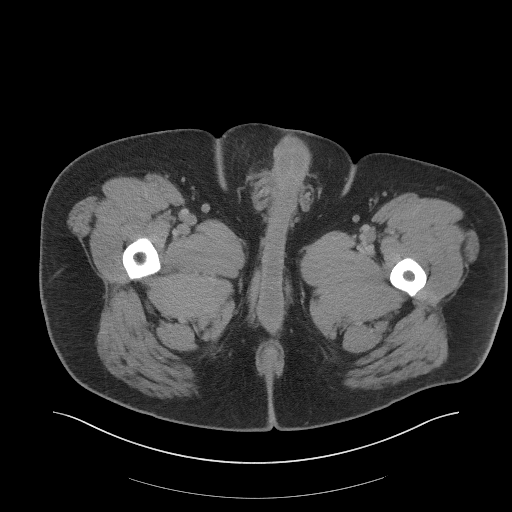
[im 26/117  soft-tissue]
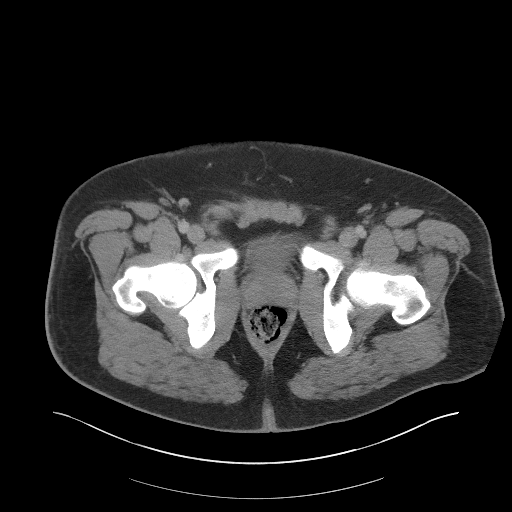
[im 33/117  soft-tissue]
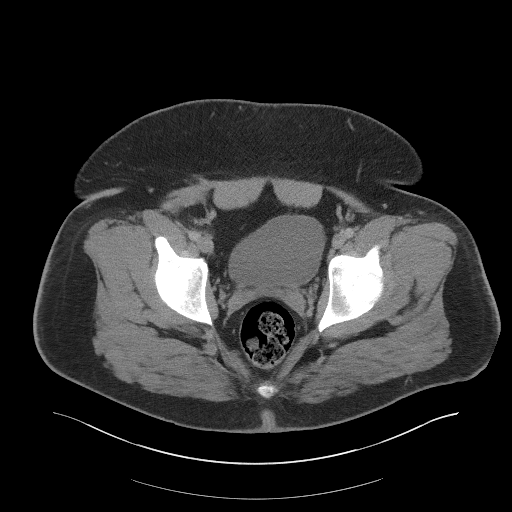
[im 39/117  soft-tissue]
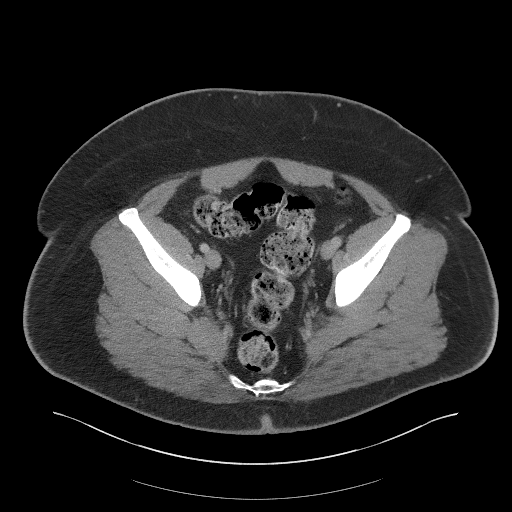
[im 52/117  soft-tissue]
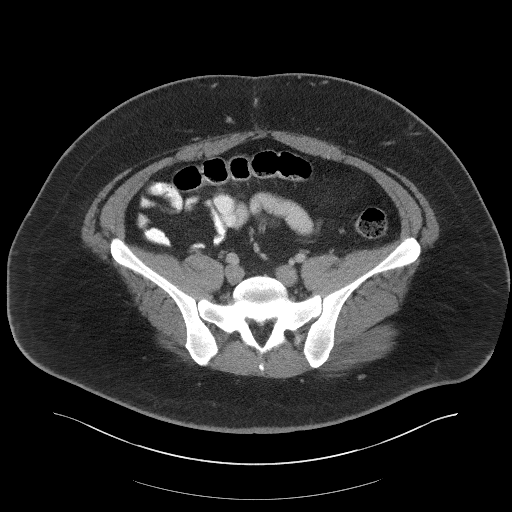
[im 59/117  soft-tissue]
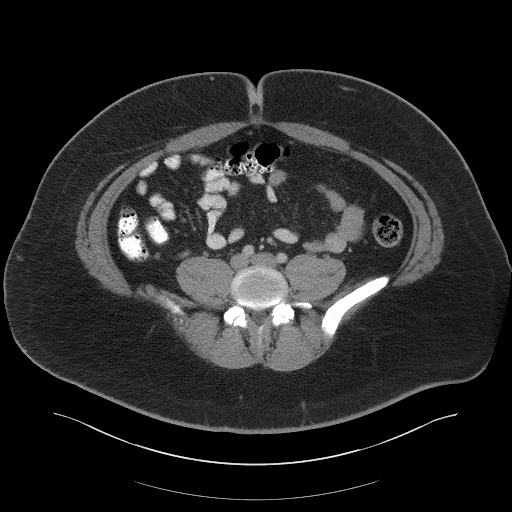
[im 65/117  soft-tissue]
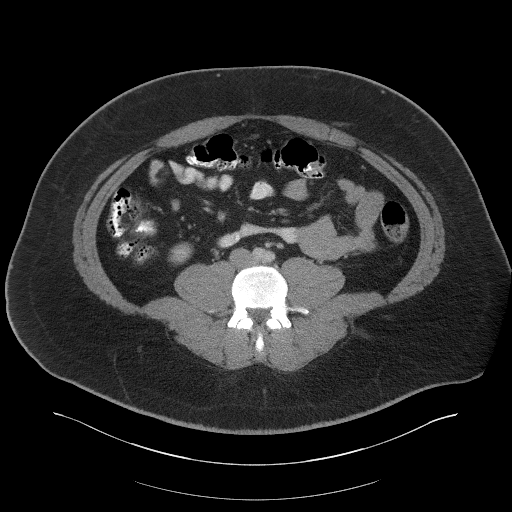
[im 78/117  soft-tissue]
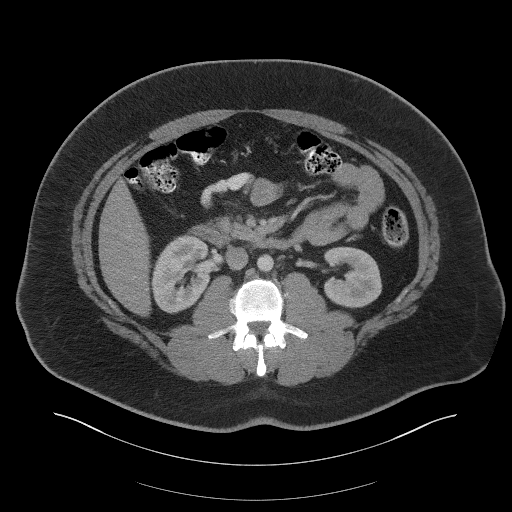
[im 78/117  bone]
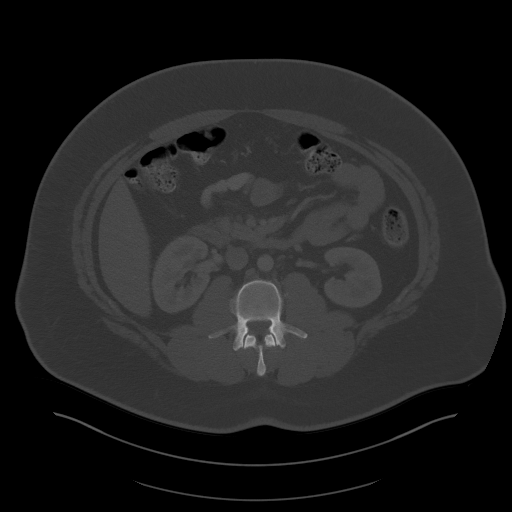
[im 84/117  soft-tissue]
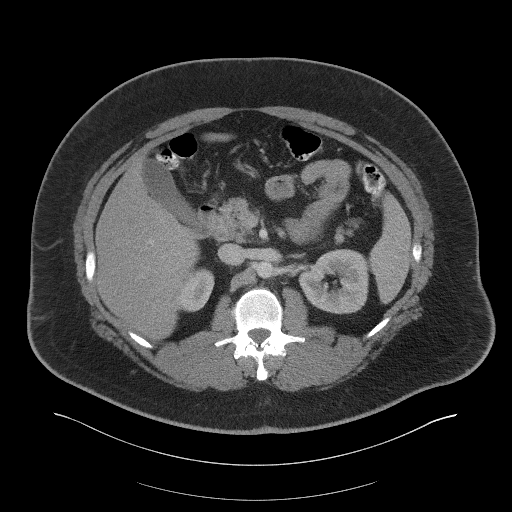
[im 91/117  soft-tissue]
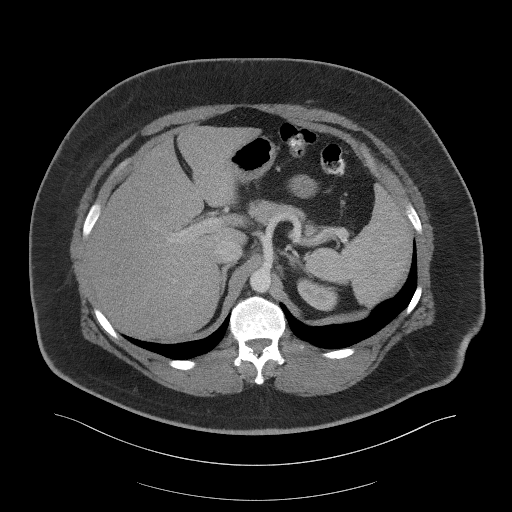
[im 104/117  soft-tissue]
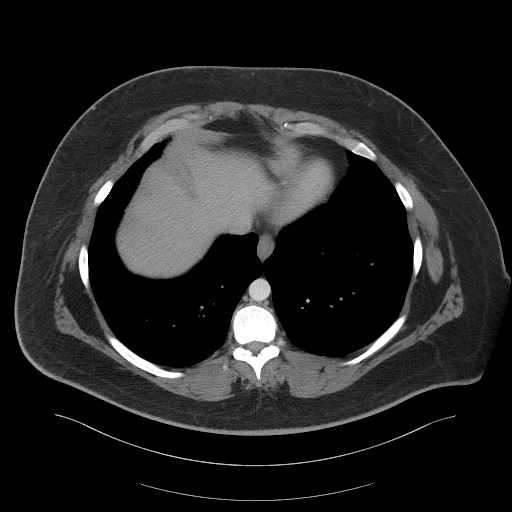
[im 110/117  soft-tissue]
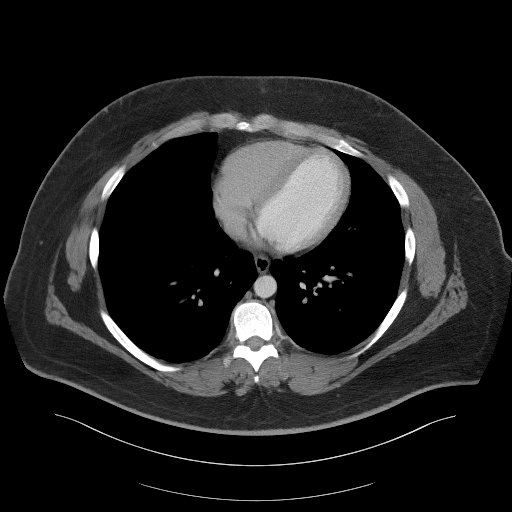

[Series 4: coronal st · coronal · 1.04mm/px · 3 of 122 slices shown]
[im 41/122  soft-tissue]
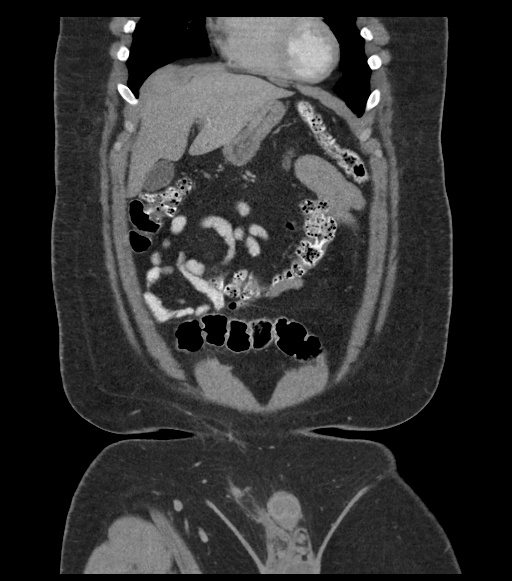
[im 54/122  soft-tissue]
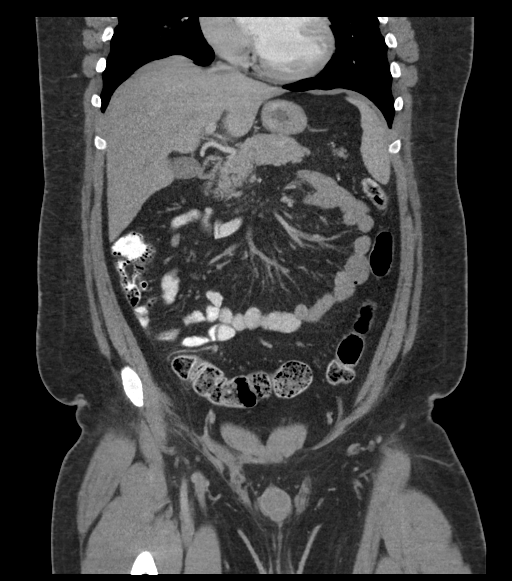
[im 68/122  soft-tissue]
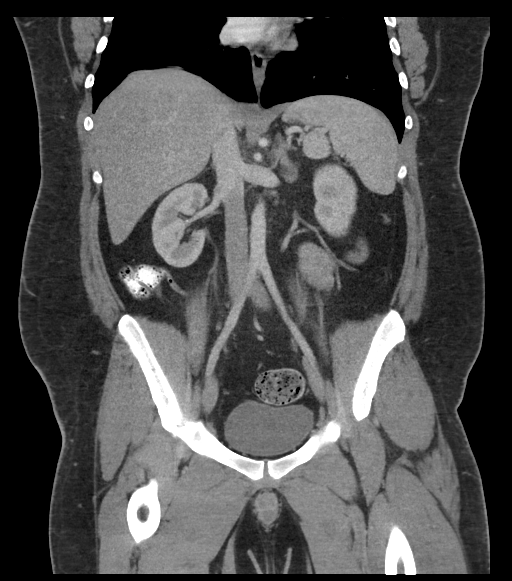

[16 of 46 positions shown; findings below may reference images not displayed]

FINDINGS: Lower chest: The lung bases are clear. No acute airspace disease. No
pleural effusion. The heart is normal in size.

Hepatobiliary: The liver is enlarged spanning 20 cm cranial caudal.
There is mild hepatic steatosis. No focal hepatic lesion.
Gallbladder physiologically distended, no calcified stone. No
biliary dilatation.

Pancreas: Unremarkable. No pancreatic ductal dilatation or
surrounding inflammatory changes.

Spleen: Normal in size without focal abnormality.

Adrenals/Urinary Tract: Normal adrenal glands. Kidneys are symmetric
in size without hydronephrosis or perinephric edema. Homogeneous
renal enhancement. No evidence of focal renal lesion or stone.
Unremarkable urinary bladder.

Stomach/Bowel: Tiny hiatal hernia. The stomach is decompressed.
Normal positioning of the duodenum and ligament of Treitz. Normal
small bowel without obstruction, wall thickening or inflammation.
Enteric contrast reaches the cecum. The appendix is normal. The
terminal ileum is normal. Small to moderate volume of stool in the
colon. There is mild colonic redundancy. No colonic wall thickening,
diverticular disease, or pericolonic inflammation.

Vascular/Lymphatic: Normal caliber abdominal aorta. Patent portal
vein. No enlarged lymph nodes in the abdomen or pelvis.

Reproductive: Prostate is unremarkable.

Other: The previous inflammatory changes in the right lower quadrant
fat have resolved. The previous soft tissue thickening in the right
inguinal canal has improved, there may be minimal residual scarring.
Prior right inguinal hernia repair. No recurrent hernia. No free air
or ascites. No abdominopelvic fluid collection. There is a
diminutive umbilical hernia containing fat.

Musculoskeletal: There are no acute or suspicious osseous
abnormalities.
IMPRESSION: 1. No acute abnormality in the abdomen/pelvis. Normal appendix.
2. Prior right inguinal hernia repair without recurrent hernia. The
right lower quadrant inflammatory changes on prior CT have resolved.
3. Hepatomegaly and mild hepatic steatosis.
